# Patient Record
Sex: Female | Born: 1965 | Race: Black or African American | Hispanic: No | Marital: Single | Smoking: Former smoker
Health system: Southern US, Community
[De-identification: ages and names within clinical notes are randomized; demographics above are authoritative.]

## PROBLEM LIST (undated history)

## (undated) DIAGNOSIS — R6883 Chills (without fever): Secondary | ICD-10-CM

## (undated) DIAGNOSIS — R5383 Other fatigue: Secondary | ICD-10-CM

## (undated) DIAGNOSIS — E785 Hyperlipidemia, unspecified: Secondary | ICD-10-CM

## (undated) DIAGNOSIS — C801 Malignant (primary) neoplasm, unspecified: Secondary | ICD-10-CM

## (undated) DIAGNOSIS — I10 Essential (primary) hypertension: Secondary | ICD-10-CM

## (undated) DIAGNOSIS — R05 Cough: Secondary | ICD-10-CM

## (undated) DIAGNOSIS — R058 Other specified cough: Secondary | ICD-10-CM

## (undated) HISTORY — DX: Essential (primary) hypertension: I10

## (undated) HISTORY — DX: Other fatigue: R53.83

## (undated) HISTORY — DX: Cough: R05

## (undated) HISTORY — DX: Chills (without fever): R68.83

## (undated) HISTORY — DX: Hyperlipidemia, unspecified: E78.5

## (undated) HISTORY — DX: Other specified cough: R05.8

## (undated) HISTORY — DX: Malignant (primary) neoplasm, unspecified: C80.1

---

## 1993-08-28 HISTORY — PX: BIOPSY BREAST: PRO8

## 1994-08-28 HISTORY — PX: APPENDECTOMY: SHX54

## 2008-10-28 ENCOUNTER — Emergency Department (HOSPITAL_COMMUNITY): Admission: EM | Admit: 2008-10-28 | Discharge: 2008-10-29 | Payer: Self-pay | Admitting: Emergency Medicine

## 2010-05-17 ENCOUNTER — Encounter: Admission: RE | Admit: 2010-05-17 | Discharge: 2010-05-17 | Payer: Self-pay | Admitting: Otolaryngology

## 2010-05-17 ENCOUNTER — Inpatient Hospital Stay (HOSPITAL_COMMUNITY)
Admission: AD | Admit: 2010-05-17 | Discharge: 2010-05-22 | Payer: Self-pay | Source: Home / Self Care | Admitting: Gastroenterology

## 2010-05-17 ENCOUNTER — Encounter: Admission: RE | Admit: 2010-05-17 | Discharge: 2010-05-17 | Payer: Self-pay | Admitting: Gastroenterology

## 2010-11-10 LAB — CBC
HCT: 37.5 % (ref 36.0–46.0)
HCT: 39 % (ref 36.0–46.0)
Hemoglobin: 13.2 g/dL (ref 12.0–15.0)
MCH: 28.9 pg (ref 26.0–34.0)
MCH: 29.9 pg (ref 26.0–34.0)
MCHC: 32.9 g/dL (ref 30.0–36.0)
MCV: 87.1 fL (ref 78.0–100.0)
MCV: 87.9 fL (ref 78.0–100.0)
MCV: 88.2 fL (ref 78.0–100.0)
MCV: 89.1 fL (ref 78.0–100.0)
Platelets: 234 10*3/uL (ref 150–400)
Platelets: 237 10*3/uL (ref 150–400)
Platelets: 237 10*3/uL (ref 150–400)
RBC: 4.39 MIL/uL (ref 3.87–5.11)
RBC: 4.48 MIL/uL (ref 3.87–5.11)
RBC: 4.6 MIL/uL (ref 3.87–5.11)
RDW: 13.5 % (ref 11.5–15.5)
RDW: 13.5 % (ref 11.5–15.5)
WBC: 10.8 10*3/uL — ABNORMAL HIGH (ref 4.0–10.5)
WBC: 12.9 10*3/uL — ABNORMAL HIGH (ref 4.0–10.5)
WBC: 8.8 10*3/uL (ref 4.0–10.5)

## 2010-11-10 LAB — DIFFERENTIAL
Basophils Absolute: 0 10*3/uL (ref 0.0–0.1)
Basophils Absolute: 0 10*3/uL (ref 0.0–0.1)
Basophils Relative: 0 % (ref 0–1)
Eosinophils Absolute: 0.1 10*3/uL (ref 0.0–0.7)
Eosinophils Absolute: 0.2 10*3/uL (ref 0.0–0.7)
Eosinophils Relative: 1 % (ref 0–5)
Eosinophils Relative: 3 % (ref 0–5)
Lymphocytes Relative: 21 % (ref 12–46)
Lymphocytes Relative: 21 % (ref 12–46)
Lymphs Abs: 1.8 10*3/uL (ref 0.7–4.0)
Monocytes Absolute: 0.7 10*3/uL (ref 0.1–1.0)
Monocytes Relative: 13 % — ABNORMAL HIGH (ref 3–12)
Neutro Abs: 5.9 10*3/uL (ref 1.7–7.7)
Neutrophils Relative %: 48 % (ref 43–77)
Neutrophils Relative %: 67 % (ref 43–77)

## 2010-11-10 LAB — GLUCOSE, CAPILLARY
Glucose-Capillary: 124 mg/dL — ABNORMAL HIGH (ref 70–99)
Glucose-Capillary: 143 mg/dL — ABNORMAL HIGH (ref 70–99)
Glucose-Capillary: 144 mg/dL — ABNORMAL HIGH (ref 70–99)
Glucose-Capillary: 161 mg/dL — ABNORMAL HIGH (ref 70–99)
Glucose-Capillary: 163 mg/dL — ABNORMAL HIGH (ref 70–99)
Glucose-Capillary: 166 mg/dL — ABNORMAL HIGH (ref 70–99)
Glucose-Capillary: 170 mg/dL — ABNORMAL HIGH (ref 70–99)
Glucose-Capillary: 174 mg/dL — ABNORMAL HIGH (ref 70–99)
Glucose-Capillary: 177 mg/dL — ABNORMAL HIGH (ref 70–99)

## 2010-11-10 LAB — COMPREHENSIVE METABOLIC PANEL
AST: 26 U/L (ref 0–37)
Albumin: 3.8 g/dL (ref 3.5–5.2)
Alkaline Phosphatase: 68 U/L (ref 39–117)
Chloride: 99 mEq/L (ref 96–112)
GFR calc Af Amer: >60 mL/min (ref >60)
Potassium: 3.5 mEq/L (ref 3.5–5.1)
Sodium: 133 mEq/L — ABNORMAL LOW (ref 135–145)
Total Bilirubin: 0.6 mg/dL (ref 0.3–1.2)

## 2010-12-08 LAB — CBC
HCT: 39.8 % (ref 36.0–46.0)
MCV: 89.3 fL (ref 78.0–100.0)
RBC: 4.46 MIL/uL (ref 3.87–5.11)
WBC: 11.2 10*3/uL — ABNORMAL HIGH (ref 4.0–10.5)

## 2010-12-08 LAB — DIFFERENTIAL
Basophils Absolute: 0 10*3/uL (ref 0.0–0.1)
Basophils Relative: 0 % (ref 0–1)
Eosinophils Relative: 0 % (ref 0–5)
Lymphocytes Relative: 17 % (ref 12–46)
Neutro Abs: 8.6 10*3/uL — ABNORMAL HIGH (ref 1.7–7.7)

## 2010-12-08 LAB — PROTIME-INR: Prothrombin Time: 13.2 seconds (ref 11.6–15.2)

## 2010-12-08 LAB — URINALYSIS, ROUTINE W REFLEX MICROSCOPIC
Hgb urine dipstick: NEGATIVE
Ketones, ur: 15 mg/dL — AB
Protein, ur: 30 mg/dL — AB
Urobilinogen, UA: 1 mg/dL (ref 0.0–1.0)

## 2010-12-08 LAB — COMPREHENSIVE METABOLIC PANEL
AST: 18 U/L (ref 0–37)
BUN: 11 mg/dL (ref 6–23)
CO2: 28 mEq/L (ref 19–32)
Chloride: 100 mEq/L (ref 96–112)
Creatinine, Ser: 0.86 mg/dL (ref 0.4–1.2)
GFR calc non Af Amer: >60 mL/min (ref >60)
Total Bilirubin: 0.6 mg/dL (ref 0.3–1.2)

## 2010-12-08 LAB — URINE MICROSCOPIC-ADD ON

## 2010-12-08 LAB — URINE CULTURE: Colony Count: 45000

## 2010-12-08 LAB — PREGNANCY, URINE: Preg Test, Ur: NEGATIVE

## 2011-01-16 ENCOUNTER — Inpatient Hospital Stay (HOSPITAL_COMMUNITY)
Admission: EM | Admit: 2011-01-16 | Discharge: 2011-01-20 | DRG: 392 | Disposition: A | Discharge status: Home / Self Care | Payer: PRIVATE HEALTH INSURANCE | Attending: Internal Medicine | Admitting: Internal Medicine

## 2011-01-16 DIAGNOSIS — E663 Overweight: Secondary | ICD-10-CM | POA: Diagnosis present

## 2011-01-16 DIAGNOSIS — E119 Type 2 diabetes mellitus without complications: Secondary | ICD-10-CM | POA: Diagnosis present

## 2011-01-16 DIAGNOSIS — E876 Hypokalemia: Secondary | ICD-10-CM | POA: Diagnosis present

## 2011-01-16 DIAGNOSIS — I10 Essential (primary) hypertension: Secondary | ICD-10-CM | POA: Diagnosis present

## 2011-01-16 DIAGNOSIS — K5732 Diverticulitis of large intestine without perforation or abscess without bleeding: Principal | ICD-10-CM | POA: Diagnosis present

## 2011-01-16 DIAGNOSIS — Z853 Personal history of malignant neoplasm of breast: Secondary | ICD-10-CM

## 2011-01-16 LAB — URINALYSIS, ROUTINE W REFLEX MICROSCOPIC
Bilirubin Urine: NEGATIVE
Hgb urine dipstick: NEGATIVE
Ketones, ur: 15 mg/dL — AB
Protein, ur: 30 mg/dL — AB
Urobilinogen, UA: 0.2 mg/dL (ref 0.0–1.0)

## 2011-01-16 LAB — URINE MICROSCOPIC-ADD ON

## 2011-01-17 ENCOUNTER — Emergency Department (HOSPITAL_COMMUNITY): Payer: PRIVATE HEALTH INSURANCE

## 2011-01-17 ENCOUNTER — Encounter (HOSPITAL_COMMUNITY): Payer: Self-pay | Admitting: Radiology

## 2011-01-17 LAB — DIFFERENTIAL
Basophils Absolute: 0 10*3/uL (ref 0.0–0.1)
Basophils Relative: 0 % (ref 0–1)
Eosinophils Absolute: 0.2 10*3/uL (ref 0.0–0.7)
Eosinophils Relative: 2 % (ref 0–5)
Lymphocytes Relative: 28 % (ref 12–46)
Lymphs Abs: 2.8 10*3/uL (ref 0.7–4.0)
Monocytes Absolute: 0.9 10*3/uL (ref 0.1–1.0)
Monocytes Relative: 8 % (ref 3–12)
Neutro Abs: 6.3 10*3/uL (ref 1.7–7.7)
Neutrophils Relative %: 62 % (ref 43–77)

## 2011-01-17 LAB — CBC
HCT: 35.8 % — ABNORMAL LOW (ref 36.0–46.0)
Hemoglobin: 11.7 g/dL — ABNORMAL LOW (ref 12.0–15.0)
MCH: 28.6 pg (ref 26.0–34.0)
MCHC: 32.7 g/dL (ref 30.0–36.0)
MCV: 87.5 fL (ref 78.0–100.0)
Platelets: 244 10*3/uL (ref 150–400)
RBC: 4.09 MIL/uL (ref 3.87–5.11)
RDW: 13.6 % (ref 11.5–15.5)
WBC: 10.2 10*3/uL (ref 4.0–10.5)

## 2011-01-17 LAB — COMPREHENSIVE METABOLIC PANEL WITH GFR
ALT: 13 U/L (ref 0–35)
AST: 12 U/L (ref 0–37)
Albumin: 3.2 g/dL — ABNORMAL LOW (ref 3.5–5.2)
Alkaline Phosphatase: 84 U/L (ref 39–117)
BUN: 16 mg/dL (ref 6–23)
CO2: 32 meq/L (ref 19–32)
Calcium: 10.4 mg/dL (ref 8.4–10.5)
Chloride: 96 meq/L (ref 96–112)
Creatinine, Ser: 0.75 mg/dL (ref 0.4–1.2)
GFR calc non Af Amer: >60 mL/min
Glucose, Bld: 179 mg/dL — ABNORMAL HIGH (ref 70–99)
Potassium: 3.2 meq/L — ABNORMAL LOW (ref 3.5–5.1)
Sodium: 137 meq/L (ref 135–145)
Total Bilirubin: 0.2 mg/dL — ABNORMAL LOW (ref 0.3–1.2)
Total Protein: 8.2 g/dL (ref 6.0–8.3)

## 2011-01-17 LAB — PREGNANCY, URINE: Preg Test, Ur: NEGATIVE

## 2011-01-17 LAB — GLUCOSE, CAPILLARY
Glucose-Capillary: 151 mg/dL — ABNORMAL HIGH (ref 70–99)
Glucose-Capillary: 125 mg/dL — ABNORMAL HIGH (ref 70–99)
Glucose-Capillary: 105 mg/dL — ABNORMAL HIGH (ref 70–99)

## 2011-01-17 LAB — LIPASE, BLOOD: Lipase: 42 U/L (ref 11–59)

## 2011-01-17 LAB — POTASSIUM: Potassium: 4.1 meq/L (ref 3.5–5.1)

## 2011-01-17 MED ORDER — IOHEXOL 300 MG/ML  SOLN
100.0000 mL | Freq: Once | INTRAMUSCULAR | Status: AC | PRN
  Administered 2011-01-17: 100 mL via INTRAVENOUS

## 2011-01-18 LAB — DIFFERENTIAL
Basophils Relative: 0 % (ref 0–1)
Eosinophils Relative: 3 % (ref 0–5)
Lymphocytes Relative: 44 % (ref 12–46)
Monocytes Absolute: 0.5 10*3/uL (ref 0.1–1.0)
Monocytes Relative: 8 % (ref 3–12)
Neutrophils Relative %: 45 % (ref 43–77)

## 2011-01-18 LAB — BASIC METABOLIC PANEL
BUN: 8 mg/dL (ref 6–23)
CO2: 27 mEq/L (ref 19–32)
Chloride: 100 mEq/L (ref 96–112)
Creatinine, Ser: 0.71 mg/dL (ref 0.4–1.2)
Glucose, Bld: 117 mg/dL — ABNORMAL HIGH (ref 70–99)
Potassium: 4 mEq/L (ref 3.5–5.1)

## 2011-01-18 LAB — CBC
HCT: 34.9 % — ABNORMAL LOW (ref 36.0–46.0)
Hemoglobin: 11.5 g/dL — ABNORMAL LOW (ref 12.0–15.0)
MCH: 28.9 pg (ref 26.0–34.0)
MCV: 87.7 fL (ref 78.0–100.0)
RBC: 3.98 MIL/uL (ref 3.87–5.11)

## 2011-01-18 LAB — GLUCOSE, CAPILLARY: Glucose-Capillary: 176 mg/dL — ABNORMAL HIGH (ref 70–99)

## 2011-01-19 LAB — GLUCOSE, CAPILLARY
Glucose-Capillary: 148 mg/dL — ABNORMAL HIGH (ref 70–99)
Glucose-Capillary: 136 mg/dL — ABNORMAL HIGH (ref 70–99)

## 2011-01-19 NOTE — Consult Note (Signed)
  [name], Mia [name]             ACCOUNT NO.:  [id number]  MEDICAL RECORD NO.:  [id number]           PATIENT TYPE:  I  LOCATION:  5020                         FACILITY:  [facility]  PHYSICIAN:  Wilmon Arms. Corliss Skains, M.D. DATE OF BIRTH:  05/04/1966  DATE OF CONSULTATION:  01/16/2011 DATE OF DISCHARGE:                                CONSULTATION   REASON FOR EVALUATION:  Sigmoid diverticulitis.  HISTORY OF PRESENT ILLNESS:  This is a 45 year old female who has had at least 3-4 previous episodes of sigmoid diverticulitis who presents with several weeks of worsening left lower quadrant suprapubic abdominal pain.  The patient has had previous GI workup with Dr. Karma Greaser of the [facility] as well as gastroenterologist in the [place] area. The patient has had intermittent diarrhea for the last several weeks. She denies any nausea or vomiting.  Her last colonoscopy was apparently late last year by Dr. Evette Cristal.  PAST MEDICAL HISTORY: 1. Diabetes type 2. 2. Mild hypertension. 3. History of breast cancer at age 40, status post chemo radiation and     surgery. 4. Irritable bowel syndrome.  PAST SURGICAL HISTORY:  Appendectomy, right breast lumpectomy, axillary lymph node dissection with 4 positive lymph nodes  ALLERGIES:  None.  MEDICATIONS:  Hydrochlorothiazide, metformin, and red yeast rice.  SOCIAL HISTORY:  Nonsmoker, social alcohol use.  REVIEW OF SYSTEMS:  Otherwise negative.  PHYSICAL EXAMINATION:  VITAL SIGNS:  Temperature 97.6, blood pressure 113/76, pulse 93, respirations 18, and sats 100%. GENERAL:  This is a well-developed, well-nourished female in no apparent distress. HEENT:  EOMI.  Sclerae icteric. NECK:  No mass.  No thyromegaly. LUNGS:  Clear.  Normal respiratory effort. HEART:  Regular rate and rhythm.  No murmur. ABDOMEN:  Healed laparoscopic incisions, occasional bowel sounds, mildly distended, tender in the left lower quadrant as well as in the suprapubic  region.  No rebound or guarding.  LABORATORY DATA:  White count 10.2, hemoglobin 11.7, and platelet count 244.  Sodium 137, potassium 3.2, BUN and creatinine 16 and 0.79.  CT scan of the abdomen and pelvis shows acute diverticulitis of the proximal sigmoid and descending colon with a 2.1-cm phlegmon, possible micro perforation in the lower midline.  IMPRESSION:  Acute diverticulitis with possible micro perforation and phlegmon.  RECOMMENDATIONS:  Bowel rest and IV antibiotics.  Hopefully, we can successfully treat this nonoperatively this admission but the patient will almost definitely need an elective colon resection.  Please try to obtain a colonoscopy report from the [facility], Dr. Luan Moore office.     Wilmon Arms. Corliss Skains, M.D.     MKT/MEDQ  D:  01/17/2011  T:  01/17/2011  Job:  [id number]  Electronically Signed by Manus Rudd M.D. on 01/19/2011 10:07:12 PM

## 2011-01-20 LAB — GLUCOSE, CAPILLARY
Glucose-Capillary: 124 mg/dL — ABNORMAL HIGH (ref 70–99)
Glucose-Capillary: 144 mg/dL — ABNORMAL HIGH (ref 70–99)

## 2011-02-04 NOTE — H&P (Signed)
Mia [name], Mia [name]             ACCOUNT NO.:  [id number]  MEDICAL RECORD NO.:  [id number]           PATIENT TYPE:  I  LOCATION:  5020                         FACILITY:  [facility]  PHYSICIAN:  Della Goo, M.D. DATE OF BIRTH:  July 07, 1966  DATE OF ADMISSION:  01/16/2011 DATE OF DISCHARGE:                             HISTORY & PHYSICAL   PRIMARY CARE PHYSICIAN:  Deirdre Peer. Polite, MD  CHIEF COMPLAINT:  Abdominal pain.  HISTORY OF PRESENT ILLNESS:  This is a 45 year old female who presented to the emergency department secondary to worsening abdominal pain in the lower quadrants, both left and right.  She has had pain off and on since December 12, 2010, and reports taking medications she had left over from a previous bouts of diverticulitis, which dampen the pain some.  She reports that the pain worsened over the past week.  She states she was having sweats and chills.  She denied having any nausea or vomiting. Denied having any hematemesis, hematochezia or melena passage.  She did report having some loose stool.  The patient was seen in the emergency department and a CT scan of the abdomen and pelvis was performed.  Results of which revealed acute diverticulitis of the distal descending, proximal sigmoid colon, a 2.1 cm phlegmon superior to the mid sigmoid colon was seen and probable microperforation or large inflamed diverticulum was seen.  The patient was started on IV antibiotic therapy of ciprofloxacin and Flagyl and referred for medical admission.  PAST MEDICAL HISTORY:  Significant for diverticulosis and diverticulitis, irritable bowel syndrome, hypertension, remote history of breast cancer, status post lumpectomy with chemotherapy and radiation treatments.  MEDICATIONS:  Include hydrochlorothiazide and metformin.  ALLERGIES:  No known drug allergies.  SOCIAL HISTORY:  The patient is a nonsmoker, nondrinker.  No history of illicit drug usage.  FAMILY HISTORY:   Positive for coronary artery disease and diabetes in her mother, positive hypertension in her father and positive cancer in her grandmother.  REVIEW OF SYSTEMS:  Pertinent as mentioned above.  PHYSICAL EXAMINATION:  GENERAL:  This is an obese 45 year old African American female who is in discomfort but no acute distress. VITAL SIGNS:  Temperature 98.6, blood pressure 119/76, heart rate 87, respirations 19, O2 saturation 98-100%. HEENT:  Normocephalic, atraumatic.  Pupils equally round and reactive to light.  Extraocular movements are intact.  Funduscopic benign.  Nares are patent bilaterally.  Oropharynx is clear.  Neck:  Supple.  Full range of motion.  No thyromegaly, adenopathy or jugular venous distention. CARDIOVASCULAR:  Regular rate and rhythm.  No murmurs, gallops or rubs. LUNGS:  Clear to auscultation bilaterally.  No rales, rhonchi or wheezes. ABDOMEN:  Positive bowel sounds, mildly decreased, soft, mildly tender in the lower quadrant.  No rebound.  No guarding.  No hepatosplenomegaly. EXTREMITIES:  Without cyanosis, clubbing or edema. NEUROLOGIC:  The patient is alert and oriented x3.  Cranial nerves are intact and motor and sensory function also intact.  LABORATORY STUDIES:  White blood cell count 10.2, hemoglobin 11.7, hematocrit 35.8, platelets 244, neutrophils 62%, lymphocytes 28%. Sodium 137, potassium 3.2, chloride 96, CO2 32, BUN 16, creatinine 0.75 and  glucose 179.  Lipase 42.  Urine HCG negative.  Urinalysis negative except for protein of 30.  Urine microscopic many epithelial cells, many bacteria.  Urinary white blood cell 0-2.  Urinary red blood cell 0-2. CT scan results as mentioned above.  ASSESSMENT:  A 45 year old female being admitted with: 1. Acute diverticulitis. 2. Abdominal pain. 3. Hyperglycemia. 4. Hypokalemia.  PLAN:  The patient will be admitted and IV antibiotic therapy will be started with ciprofloxacin and metronidazole.  Pain control  therapy has been ordered along with antiemetic therapy as needed.  Potassium supplementation has been administered in the emergency department and DVT prophylaxis has been ordered as well and sliding-scale insulin coverage has been ordered for elevated blood sugars since the patient has an elevated blood sugar on admission.  She does take metformin therapy.  This will be held for now.  Code status is a full code at this time.     Della Goo, M.D.     HJ/MEDQ  D:  01/17/2011  T:  01/17/2011  Job:  [id number]  cc:   Deirdre Peer. Polite, M.D.  Electronically Signed by Della Goo M.D. on 02/04/2011 06:22:36 AM

## 2011-02-07 NOTE — Discharge Summary (Signed)
  NAMELAVONDA, Mia [name]             ACCOUNT NO.:  [id number]  MEDICAL RECORD NO.:  [id number]           PATIENT TYPE:  I  LOCATION:  5020                         FACILITY:  [facility]  PHYSICIAN:  Mia [name], M.D. DATE OF BIRTH:  Jan 30, 1966  DATE OF ADMISSION:  01/16/2011 DATE OF DISCHARGE:  01/20/2011                              DISCHARGE SUMMARY   DISCHARGE DIAGNOSES: 1. Diverticulitis with associated phlegmon, treated with IV Cipro and     Flagyl, converted to p.o. Cipro and Flagyl, tolerating p.o. diet at     the time of discharge.  Please note the patient has been seen by     Surgery during this hospitalization with plans for outpatient     followup to discuss colectomy. 2. Hypertension. 3. Diabetes. 4. Overweight. 5. History of breast cancer.  DISCHARGE MEDICATIONS: 1. Cipro 500 mg b.i.d. 2. Flagyl 500 mg q.8 h. 3. Oxycodone 5 mg q.4 h. p.r.n. 4. Hydrochlorothiazide 25 mg daily. 5. Metamucil daily. 6. Metformin XR 500 mg b.i.d. 7. Multivitamin daily. 8. Red yeast rice. 9. Vitamin D.  DISPOSITION:  The patient discharged to home in stable condition.  I asked follow with primary MD in 1 week.  I asked to follow up with surgeon in approximately 3 weeks.  CONSULTANTS:  General Surgery.  STUDIES:  CBC:  White count 6.1, hemoglobin 11.5, platelet 254.  Basic metabolic panel unremarkable.  Hemoglobin A1c 7.6.  CT of the abdomen and pelvis, acute diverticulitis involving the distal descending proximal sigmoid colon, 2.1 cm phlegmon noted directly superior to the mid sigmoid colon with a few tiny foci of a possibly reflecting microperforation or any inflamed large diverticulum.  No evidence of abscess formation at this time.  HISTORY OF PRESENT ILLNESS:  A 45 year old female who presented to the ED with complaint of the abdominal pain, which had been on and off since April 2011.  Of note, she states she had some antibiotics at home, which she took a few of, which  helped; however, her symptoms worsened. Therefore, she presented to the ED with above complaint.  CT in the emergency room revealed acute diverticulitis.  Admission was deemed necessary for further evaluation and treatment.  Please see dictated H and P for further details.  Past medical history, medications, social history, allergies, and family history per admission H and P.  HOSPITAL COURSE:  The patient was admitted to the medicine floor bed for evaluation and treatment of acute diverticulitis with associated phlegmon.  Please note Surgery was consulted.  The patient was placed on bowel rest, IV fluids, and IV antibiotics.  Her hospital course was one of slow, but continued improvement.  She was ultimately converted to p.o. antibiotics and resumption of diet with good tolerance.  The patient is discharged in stable condition.  FOLLOWUP:  As discussed above.     Mia [name], M.D.     RDP/MEDQ  D:  01/20/2011  T:  01/21/2011  Job:  [id number]  Electronically Signed by Windy Fast Fartun Paradiso M.D. on 02/07/2011 08:31:35 AM

## 2011-03-08 ENCOUNTER — Encounter (INDEPENDENT_AMBULATORY_CARE_PROVIDER_SITE_OTHER): Payer: Self-pay | Admitting: General Surgery

## 2011-03-08 ENCOUNTER — Ambulatory Visit (INDEPENDENT_AMBULATORY_CARE_PROVIDER_SITE_OTHER): Payer: PRIVATE HEALTH INSURANCE | Admitting: General Surgery

## 2011-03-08 VITALS — BP 134/90 | HR 68 | Temp 96.0°F | Ht 59.0 in | Wt 183.2 lb

## 2011-03-08 DIAGNOSIS — K5732 Diverticulitis of large intestine without perforation or abscess without bleeding: Secondary | ICD-10-CM

## 2011-03-08 MED ORDER — METRONIDAZOLE 500 MG PO TABS: 500.0000 mg | ORAL_TABLET | Freq: Three times a day (TID) | ORAL | Status: AC

## 2011-03-08 MED ORDER — CIPROFLOXACIN HCL 500 MG PO TABS: 500.0000 mg | ORAL_TABLET | Freq: Two times a day (BID) | ORAL | Status: AC

## 2011-03-08 NOTE — Progress Notes (Signed)
Subjective:     Patient ID: Mia [name], female   DOB: 1966-03-10, 45 y.o.   MRN: [id number]    BP 134/90  Pulse 68  Temp(Src) 96 F (35.6 C) (Temporal)  Ht 4\' 11"  (1.499 m)  Wt 183 lb 3.2 oz (83.099 kg)  BMI 37.00 kg/m2    HPI Patient returns for followup after hospitalization for sigmoid diverticulitis with phlegmon. The patient was discharged on 01/20/11. She was discharged on ciprofloxacin and Flagyl. She ran out of the Cipro but is taking the Flagyl. She still has pain in the lower abdomen on the left side & in the suprapubic area. Bowel movements are normal once a day. She is otherwise feeling well. She is interested in having surgery.  Review of Systems  Constitutional: Positive for chills and fatigue.  HENT: Negative.   Respiratory: Negative.   Cardiovascular: Negative.   Gastrointestinal: Positive for abdominal pain.  Musculoskeletal: Negative.   Skin: Negative.   Neurological: Negative.   Hematological: Negative.   Psychiatric/Behavioral: Negative.        Objective:   Physical Exam  Constitutional: She is oriented to person, place, and time. She appears well-developed and well-nourished. No distress.  HENT:  Head: Normocephalic and atraumatic.  Nose: Nose normal.  Mouth/Throat: Oropharynx is clear and moist.  Eyes: Pupils are equal, round, and reactive to light. Right eye exhibits no discharge. No scleral icterus.  Neck: Normal range of motion.  Cardiovascular: Normal rate, regular rhythm, normal heart sounds and intact distal pulses.   No murmur heard. Pulmonary/Chest: Effort normal. No respiratory distress. She has no wheezes. She has no rales. She exhibits no tenderness.  Abdominal: Soft. She exhibits mass. She exhibits no distension. There is tenderness. There is no rebound and no guarding.  Musculoskeletal: Normal range of motion. She exhibits no edema.  Neurological: She is alert and oriented to person, place, and time.  Skin: Skin is warm and dry.      Of note her abdominal tenderness is mostly in the left lower quadrant and suprapubic area. There is a vague fullness there. Assessment:     Sigmoid diverticulitis with phlegmon. There is still ongoing inflammation precluding surgery until this resolves.    Plan:     Refill Cipro and Flagyl. I will see her back in 6 weeks. At that time if she is better, we will move forward with planning elective colectomy as a single-stage procedure. This procedure including some of the risk and benefits were discussed at this time. We also discussed the process of getting bowel prep. We will go over things further on her next visit .

## 2011-03-13 ENCOUNTER — Telehealth (INDEPENDENT_AMBULATORY_CARE_PROVIDER_SITE_OTHER): Payer: Self-pay | Admitting: General Surgery

## 2011-03-15 ENCOUNTER — Telehealth (INDEPENDENT_AMBULATORY_CARE_PROVIDER_SITE_OTHER): Payer: Self-pay

## 2011-03-15 NOTE — Telephone Encounter (Signed)
Mia [name] called and said her cipro medicine was $100 after insurance and was wondering if there was any other medication that could be called in in place of cipro that cost less.

## 2011-05-03 ENCOUNTER — Encounter (INDEPENDENT_AMBULATORY_CARE_PROVIDER_SITE_OTHER): Payer: Self-pay | Admitting: General Surgery

## 2011-05-03 ENCOUNTER — Ambulatory Visit (INDEPENDENT_AMBULATORY_CARE_PROVIDER_SITE_OTHER): Payer: PRIVATE HEALTH INSURANCE | Admitting: General Surgery

## 2011-05-03 VITALS — BP 142/94 | HR 80

## 2011-05-03 DIAGNOSIS — K5732 Diverticulitis of large intestine without perforation or abscess without bleeding: Secondary | ICD-10-CM

## 2011-05-03 NOTE — Progress Notes (Signed)
Subjective:     Patient ID: Mia [name], female   DOB: [birth year], 45 y.o.   MRN: [id number]  HPI Patient presents for followup status post hospitalization for significant rectosigmoid diverticulitis. She perforation abscess at that time. Since then she is doing much better. She still occasionally has crampy abdominal pain for which she takes some pain medication she is eating and moving her bowels without difficulty. She's had no fevers. She has some occasional hot flashes which she feels is premenopausal. She is going to see her gynecologist next week for his other complaint.  Review of Systems  Constitutional: Negative for chills and appetite change.  HENT: Negative.   Eyes: Negative.   Respiratory: Negative.   Cardiovascular: Negative.   Gastrointestinal: Negative for abdominal distention.  Genitourinary: Negative.        Objective:   Physical Exam  Constitutional: She is oriented to person, place, and time. She appears well-developed and well-nourished.  HENT:  Head: Normocephalic.  Mouth/Throat: Oropharynx is clear and moist.  Eyes: Conjunctivae are normal. Pupils are equal, round, and reactive to light.  Neck: Neck supple. No tracheal deviation present.  Cardiovascular: Normal rate, regular rhythm and normal heart sounds.   Pulmonary/Chest: Effort normal and breath sounds normal. She has no wheezes. She has no rales. She exhibits no tenderness.  Abdominal: Soft. She exhibits no distension and no mass. There is no tenderness. There is no rebound and no guarding.  Neurological: She is alert and oriented to person, place, and time.  Skin: Skin is warm.       Assessment:     Rectosigmoid diverticulitis. Occasional continued pain.    Plan:     As per previous lengthy discussion, I offered colon resection of the affected area. We discussed the procedure risks and benefits in detail. Plan sigmoid or rectosigmoid resection with primary anastomosis. She was given literature on  her colon surgery. Risks including but not limited to bleeding infection anastomotic leak abscess cardiac and pulmonary consultations were discussed. She is agreeable questions were answered. We discussed the bowel prep. We look forward to scheduling in the near future.

## 2011-06-06 ENCOUNTER — Encounter (HOSPITAL_COMMUNITY)
Admission: RE | Admit: 2011-06-06 | Discharge: 2011-06-06 | Disposition: A | Discharge status: Home / Self Care | Payer: PRIVATE HEALTH INSURANCE | Source: Ambulatory Visit | Attending: General Surgery | Admitting: General Surgery

## 2011-06-06 ENCOUNTER — Other Ambulatory Visit (INDEPENDENT_AMBULATORY_CARE_PROVIDER_SITE_OTHER): Payer: Self-pay | Admitting: General Surgery

## 2011-06-06 LAB — CBC
Hemoglobin: 13.4 g/dL (ref 12.0–15.0)
MCH: 29.5 pg (ref 26.0–34.0)
MCHC: 33.4 g/dL (ref 30.0–36.0)
MCV: 88.1 fL (ref 78.0–100.0)
RBC: 4.55 MIL/uL (ref 3.87–5.11)

## 2011-06-06 LAB — BASIC METABOLIC PANEL
Calcium: 10.3 mg/dL (ref 8.4–10.5)
Creatinine, Ser: 0.72 mg/dL (ref 0.50–1.10)
GFR calc Af Amer: >90 mL/min (ref >90)
GFR calc non Af Amer: >90 mL/min (ref >90)
Sodium: 136 mEq/L (ref 135–145)

## 2011-06-06 LAB — SURGICAL PCR SCREEN: Staphylococcus aureus: NEGATIVE

## 2011-06-06 LAB — TYPE AND SCREEN
ABO/RH(D): O POS
Antibody Screen: NEGATIVE

## 2011-06-06 LAB — HCG, SERUM, QUALITATIVE: Preg, Serum: NEGATIVE

## 2011-06-12 ENCOUNTER — Inpatient Hospital Stay (HOSPITAL_COMMUNITY)
Admission: RE | Admit: 2011-06-12 | Discharge: 2011-06-18 | DRG: 331 | Disposition: A | Discharge status: Home / Self Care | Payer: PRIVATE HEALTH INSURANCE | Source: Ambulatory Visit | Attending: General Surgery | Admitting: General Surgery

## 2011-06-12 ENCOUNTER — Other Ambulatory Visit (INDEPENDENT_AMBULATORY_CARE_PROVIDER_SITE_OTHER): Payer: Self-pay | Admitting: General Surgery

## 2011-06-12 DIAGNOSIS — Z01818 Encounter for other preprocedural examination: Secondary | ICD-10-CM

## 2011-06-12 DIAGNOSIS — Z01812 Encounter for preprocedural laboratory examination: Secondary | ICD-10-CM

## 2011-06-12 DIAGNOSIS — K5732 Diverticulitis of large intestine without perforation or abscess without bleeding: Principal | ICD-10-CM | POA: Diagnosis present

## 2011-06-12 DIAGNOSIS — I10 Essential (primary) hypertension: Secondary | ICD-10-CM | POA: Diagnosis present

## 2011-06-12 DIAGNOSIS — E8779 Other fluid overload: Secondary | ICD-10-CM | POA: Diagnosis not present

## 2011-06-12 DIAGNOSIS — E119 Type 2 diabetes mellitus without complications: Secondary | ICD-10-CM | POA: Diagnosis present

## 2011-06-12 DIAGNOSIS — Z01810 Encounter for preprocedural cardiovascular examination: Secondary | ICD-10-CM

## 2011-06-12 DIAGNOSIS — Z79899 Other long term (current) drug therapy: Secondary | ICD-10-CM

## 2011-06-12 DIAGNOSIS — Z853 Personal history of malignant neoplasm of breast: Secondary | ICD-10-CM

## 2011-06-12 HISTORY — PX: OTHER SURGICAL HISTORY: SHX169

## 2011-06-12 LAB — GLUCOSE, CAPILLARY
Glucose-Capillary: 151 mg/dL — ABNORMAL HIGH (ref 70–99)
Glucose-Capillary: 161 mg/dL — ABNORMAL HIGH (ref 70–99)
Glucose-Capillary: 170 mg/dL — ABNORMAL HIGH (ref 70–99)
Glucose-Capillary: 259 mg/dL — ABNORMAL HIGH (ref 70–99)

## 2011-06-13 DIAGNOSIS — E119 Type 2 diabetes mellitus without complications: Secondary | ICD-10-CM

## 2011-06-13 LAB — CBC
HCT: 34.4 % — ABNORMAL LOW (ref 36.0–46.0)
Platelets: 235 10*3/uL (ref 150–400)
RBC: 3.83 MIL/uL — ABNORMAL LOW (ref 3.87–5.11)
RDW: 13.8 % (ref 11.5–15.5)
WBC: 11.7 10*3/uL — ABNORMAL HIGH (ref 4.0–10.5)

## 2011-06-13 LAB — GLUCOSE, CAPILLARY: Glucose-Capillary: 136 mg/dL — ABNORMAL HIGH (ref 70–99)

## 2011-06-13 LAB — BASIC METABOLIC PANEL
BUN: 7 mg/dL (ref 6–23)
CO2: 25 mEq/L (ref 19–32)
Chloride: 101 mEq/L (ref 96–112)
GFR calc Af Amer: >90 mL/min (ref >90)
Potassium: 4.1 mEq/L (ref 3.5–5.1)

## 2011-06-13 NOTE — Op Note (Signed)
NAMEANESHA, HACKERT             ACCOUNT NO.:  [id number]  MEDICAL RECORD NO.:  [id number]  LOCATION:  5153                         FACILITY:  [facility]  PHYSICIAN:  Gabrielle Dare. Janee Morn, M.D.DATE OF BIRTH:  [birth year]  DATE OF PROCEDURE:  06/12/2011 DATE OF DISCHARGE:                              OPERATIVE REPORT   PREOPERATIVE DIAGNOSIS:  Sigmoid diverticulitis.  POSTOPERATIVE DIAGNOSIS:  Sigmoid diverticulitis with evidence of previous abscess in the pelvis.  PROCEDURE:  Sigmoid colectomy.  SURGEON:  Gabrielle Dare. Janee Morn, MD  ESTIMATED BLOOD LOSS:  200 mL.  HISTORY OF PRESENT ILLNESS:  Ms. Bevans is a 45 year old African American female who has had multiple episodes of recurrent sigmoid diverticulitis.  The last episode she had was in April of this year and she was admitted, she was treated with IV antibiotics.  There was a phlegmon present on CT scan, however, no discrete abscess that required drainage.  She was treated with antibiotics and improved significantly due to her multiple attacks over the past 8 years.  Recommendation was for sigmoid colectomy.  We allowed time for the inflammation to go down to give [place] the best chance of her healing of primary anastomosis.  She underwent a bowel prep and presents for elective colectomy.  PROCEDURE IN DETAIL:  Informed consent was obtained.  The patient was identified in the preop holding area.  She received intravenous antibiotics.  She is on enteric protocol.  She was brought to the operating room.  General endotracheal anesthesia was administered by the Anesthesia staff.  Her abdomen was prepped and draped in sterile fashion.  She was in lithotomy position and her perineum and perirectal area was also prepped and draped in sterile fashion.  A Foley catheter had been placed by the nursing staff prior to this.  Next, we did time- out procedure, then a lower midline incision was made and extended up just above the umbilicus.   Subcutaneous tissues were dissected down, revealing the anterior fascia.  This was divided sharply along the midline and the peritoneal cavity was entered under direct vision.  The fascia was opened to the length of the skin incision.  Exploration revealed evidence of chronic diverticulitis in the sigmoid colon extending down to the rectosigmoid.  There was evidence of a previous abscess formation with the cecum actually stuck to the right side of the colon in a dense fashion.  Other adhesions that were present down in the pelvis were taken down carefully.  Next, we carefully freed up the cecum from the sidewall of sigmoid colon.  This was very dense and woody and on the sigmoid colon side of inflammation, we were able to bring the sigmoid off without making any perforations.  There was a small serosal tear, however, and this was repaired with interrupted 2-0 silk sutures. Next, the sigmoid colon was mobilized from its lateral peritoneal attachments and that was continued up along the white line of Toldt up along the left gutter.  The sigma was the delivered further up out of the pelvis and we could see down in the peritoneal reflection, the disease process extended from the proximal sigmoid down to the rectosigmoid area.  There was an area clear  of inflammation, however, just above the peritoneal reflection and there was no significant diverticular disease at that point.  Next, we identified the ureter near the left common iliac and the gonadal vessels.  Due to the chronic inflammation, this took a bit of dissection, but we were able to be locate it and keep it far away from our field on the left side.  Next, the proximal sigmoid was divided with a GIA 75 stapler and a very healthy level and above the area of severe inflammation.  We then took down the mesentery of the colon with a combination of LigaSure and clamps and ties with 0 silk sutures.  Good hemostasis was kept along the way  and we stayed along the colon and well away from the ureters.  We continued this down until we were pass all disease process in the rectosigmoid.  This area was cleared off a little bit further and then divided with a contour stapler.  The specimen was marked for anatomic orientation and passed off.  We then mobilized the left colon up and including a portion of the beginnings of the splenic flexure and then mobilized some of the filmy part of the mesentery more distally.  There were some small bleeders there, they were suture ligated x1.  The colon remained pink and viable.  At this time, the pelvis was irrigated.  Our distal rectosigmoid stump looked good.  We cleared off the end of our proximal stump and then removed the staple line.  Sizers were used and the colon was in general very small, only a 25 sizer could fit and that was a bit snug, so we selected a 25 EEA stapler.  Next, the anvil from the EEA was placed in the colon and then after a secured pursestring suture of 3-0 Prolene was placed, the anvil was inserted and the pursestring was tied securely.  Next, assistant went down below and did rigid procto, examined the stump, and noted no masses.  The EEA stapler was then inserted with assistance from below.  The point was advanced out posterior to the contour staple line and the rectosigmoid stump are proximal and laid down nicely without any tension.  This was attached to the EEA and the EEA stapler was closed and fired in standard fashion. The staple line was visualized and appeared intact.  Procto was reinserted by the assistant and the colon was insufflated with air while the abdomen was filled with saline.  There were no air bubbles.  There was no bleeding noted on the procto.  The procto was removed and we all changed our gloves.  The staple line was inspected and appeared completely viable and intact.  There was no significant tension.  The cecum was reinspected and that  serosal repair area looked good.  All sponges were removed from the abdomen.  The abdomen was copiously irrigated with warm saline, irrigation, fluid returned clear.  The omentum and bowel were returned to anatomic position and the fascia was closed with running #1 looped PDS from each end of the fascia and tied in the middle.  Subcu tissues were irrigated and skin was closed loosely with staples followed by Telfa wicks.  Sponge, needle, and instrument counts were again correct at the completion of the procedure.  The patient tolerated the procedure well without apparent complication and was taken to recovery room in stable condition.     Gabrielle Dare Janee Morn, M.D.     BET/MEDQ  D:  06/12/2011  T:  06/13/2011  Job:  [id number]  cc:   Deirdre Peer. Polite, M.D. Graylin Shiver, M.D.  Electronically Signed by Violeta Gelinas M.D. on 06/13/2011 01:32:40 PM

## 2011-06-14 LAB — CBC
MCH: 29.1 pg (ref 26.0–34.0)
MCV: 89.4 fL (ref 78.0–100.0)
Platelets: 230 10*3/uL (ref 150–400)
RDW: 13.8 % (ref 11.5–15.5)

## 2011-06-14 LAB — GLUCOSE, CAPILLARY
Glucose-Capillary: 116 mg/dL — ABNORMAL HIGH (ref 70–99)
Glucose-Capillary: 120 mg/dL — ABNORMAL HIGH (ref 70–99)
Glucose-Capillary: 137 mg/dL — ABNORMAL HIGH (ref 70–99)

## 2011-06-14 LAB — BASIC METABOLIC PANEL
Calcium: 8.4 mg/dL (ref 8.4–10.5)
Creatinine, Ser: 0.75 mg/dL (ref 0.50–1.10)
GFR calc Af Amer: >90 mL/min (ref >90)
GFR calc non Af Amer: >90 mL/min (ref >90)
Sodium: 138 mEq/L (ref 135–145)

## 2011-06-15 DIAGNOSIS — D649 Anemia, unspecified: Secondary | ICD-10-CM

## 2011-06-15 DIAGNOSIS — E8779 Other fluid overload: Secondary | ICD-10-CM

## 2011-06-15 LAB — GLUCOSE, CAPILLARY
Glucose-Capillary: 105 mg/dL — ABNORMAL HIGH (ref 70–99)
Glucose-Capillary: 109 mg/dL — ABNORMAL HIGH (ref 70–99)
Glucose-Capillary: 122 mg/dL — ABNORMAL HIGH (ref 70–99)
Glucose-Capillary: 127 mg/dL — ABNORMAL HIGH (ref 70–99)
Glucose-Capillary: 90 mg/dL (ref 70–99)

## 2011-06-16 LAB — GLUCOSE, CAPILLARY
Glucose-Capillary: 112 mg/dL — ABNORMAL HIGH (ref 70–99)
Glucose-Capillary: 131 mg/dL — ABNORMAL HIGH (ref 70–99)
Glucose-Capillary: 103 mg/dL — ABNORMAL HIGH (ref 70–99)

## 2011-06-17 LAB — GLUCOSE, CAPILLARY
Glucose-Capillary: 131 mg/dL — ABNORMAL HIGH (ref 70–99)
Glucose-Capillary: 119 mg/dL — ABNORMAL HIGH (ref 70–99)

## 2011-06-19 ENCOUNTER — Encounter (INDEPENDENT_AMBULATORY_CARE_PROVIDER_SITE_OTHER): Payer: Self-pay

## 2011-06-19 LAB — GLUCOSE, CAPILLARY: Glucose-Capillary: 119 mg/dL — ABNORMAL HIGH (ref 70–99)

## 2011-06-21 ENCOUNTER — Ambulatory Visit (INDEPENDENT_AMBULATORY_CARE_PROVIDER_SITE_OTHER): Payer: PRIVATE HEALTH INSURANCE | Admitting: General Surgery

## 2011-06-21 ENCOUNTER — Encounter (INDEPENDENT_AMBULATORY_CARE_PROVIDER_SITE_OTHER): Payer: Self-pay | Admitting: General Surgery

## 2011-06-21 VITALS — BP 122/82 | HR 60 | Temp 97.2°F | Resp 18 | Ht 59.0 in | Wt 177.4 lb

## 2011-06-21 DIAGNOSIS — Z9889 Other specified postprocedural states: Secondary | ICD-10-CM

## 2011-06-21 DIAGNOSIS — Z9049 Acquired absence of other specified parts of digestive tract: Secondary | ICD-10-CM

## 2011-06-21 MED ORDER — AMOXICILLIN-POT CLAVULANATE 875-125 MG PO TABS: 1.0000 | ORAL_TABLET | Freq: Two times a day (BID) | ORAL | Status: AC

## 2011-06-21 NOTE — Progress Notes (Signed)
Subjective:     Patient ID: Mia [name], female   DOB: February 19, 1966, 45 y.o.   MRN: [id number]  HPI Patient is status post sigmoid colectomy for recurrent diverticulitis. She's been doing well at home. She is a small amount of drainage from her wound. She is eating and passing a lot of gas. She is moving her bowels several times but not everyday. She still taking pain medication.  Review of Systems     Objective:   Physical Exam    Abdomen is soft and nondistended there is mild incisional tenderness. There is no generalized tenderness. Bowel sounds are good. The upper third of her wound has a small amount of erythema. There is some clear drainage. 2 staples were removed and some serum was evacuated. There was no frank purulence. Because dressing was applied and the rest of the staples were left in place. Assessment:     Coming along well after sigmoid colectomy for recurrent diverticulitis    Plan:     Instructions were given to place the patient on Augmentin for a ten-day course. I will see her back next week. She'll continue to avoid heavy lifting for 6 weeks after surgery.

## 2011-06-21 NOTE — Patient Instructions (Signed)
Change gauze daily. Take antibiotics as directed.

## 2011-06-25 NOTE — Discharge Summary (Signed)
  NAMEKAMORIE, ALDOUS             ACCOUNT NO.:  [id number]  MEDICAL RECORD NO.:  [id number]  LOCATION:  5153                         FACILITY:  [facility]  PHYSICIAN:  Gabrielle Dare. Janee Morn, M.D.DATE OF BIRTH:  Dec 02, 1965  DATE OF ADMISSION:  06/12/2011 DATE OF DISCHARGE:  06/18/2011                              DISCHARGE SUMMARY   DISCHARGE DIAGNOSIS:  Status post sigmoid colectomy for chronic recurrent sigmoid diverticulitis.  HISTORY OF PRESENT ILLNESS:  Ms.  Oliger is a 45 year old African American female with a history of diabetes who has had chronic recurrent diverticulitis.  She presents for elective resection.  HOSPITAL COURSE:  The patient underwent an uncomplicated sigmoid colectomy with primary anastomosis on the day of admission. Postoperatively, she was on enteric protocol.  Her blood sugars were controlled with sliding scale.  She remained afebrile and hemodynamically stable.  Her ileus gradually resolved and she tolerated advancement of her diet.  Pain control was gradually able to be obtained with oral medications and she is discharged home on postop day #6 in stable condition.  DISCHARGED DIET:  Regular, diabetic as previously.  DISCHARGE ACTIVITY:  No lifting.  DISCHARGE MEDICATIONS: 1. Oxycodone 5-10 mg p.o. q.4 hours p.r.n. pain. 2. Ambien 10 mg p.o. nightly as needed for sleep. 3. She is also to continue her home medications of Lasix 20 mg daily. 4. Metformin 500 mg b.i.d. 5. Simvastatin 10 mg daily. Followup is on June 21, 2011, with myself.     Gabrielle Dare Janee Morn, M.D.     BET/MEDQ  D:  06/18/2011  T:  06/18/2011  Job:  [id number]  cc:   Deirdre Peer. Polite, M.D. Graylin Shiver, M.D.  Electronically Signed by Violeta Gelinas M.D. on 06/25/2011 08:50:59 AM

## 2011-06-26 ENCOUNTER — Telehealth (INDEPENDENT_AMBULATORY_CARE_PROVIDER_SITE_OTHER): Payer: Self-pay

## 2011-06-26 NOTE — Telephone Encounter (Signed)
I spoke to Dr Michaell Cowing.  He gave me an order for Phenergen 12.5mg  1-2 po q6hrs prn nausea #30 plus 2 refills.  She should stay on clear liquids for 2 days and if no better, needs to be seen.  I called the rx in to [facility] in [place] [phone] and notified the pt.  She actually has an appointment this week 10-31 with Dr Janee Morn.

## 2011-06-26 NOTE — Telephone Encounter (Signed)
Patient states she is nauseated since the weekend but not vomiting.  She has decreased appetite, no fever.  She is s/p sigmoid colectomy 10/15.  She would like something for nausea.  Of note, she is on Augmentin per Dr Janee Morn last Wednesday.   Her pharmacy right now is [facility] [place] [phone].

## 2011-06-28 ENCOUNTER — Encounter (INDEPENDENT_AMBULATORY_CARE_PROVIDER_SITE_OTHER): Payer: Self-pay | Admitting: General Surgery

## 2011-06-28 ENCOUNTER — Ambulatory Visit (INDEPENDENT_AMBULATORY_CARE_PROVIDER_SITE_OTHER): Payer: PRIVATE HEALTH INSURANCE | Admitting: General Surgery

## 2011-06-28 VITALS — BP 132/88 | HR 60 | Temp 98.8°F | Resp 20 | Ht 59.0 in | Wt 168.2 lb

## 2011-06-28 DIAGNOSIS — Z9889 Other specified postprocedural states: Secondary | ICD-10-CM

## 2011-06-28 DIAGNOSIS — K5732 Diverticulitis of large intestine without perforation or abscess without bleeding: Secondary | ICD-10-CM

## 2011-06-28 DIAGNOSIS — Z9049 Acquired absence of other specified parts of digestive tract: Secondary | ICD-10-CM

## 2011-06-28 NOTE — Patient Instructions (Signed)
Wet to dry dressings daily.

## 2011-06-28 NOTE — Progress Notes (Signed)
Subjective:     Patient ID: Mia [name], female   DOB: 02-13-1966, 45 y.o.   MRN: [id number]  HPI  Patient is status post sigmoid colectomy for diverticulitis on June 12, 2011. On her last visit she had a small wound infection in the upper portion of her incision. She's been doing wet to dries on that area to help with her cousin who is an Charity fundraiser. She is also taking Augmentin. The omentum has been given her some significant nausea. She is moving her bowels daily. Review of Systems     Objective:   Physical Exam On physical exam remainder of her wound is healing well the rest of the staples were removed and Steri-Strips were applied. The opening at the top is about 2.5 cm x 1 cm. It is clean granulation tissue. There was some serous drainage on her dressing. There is no odor. There is no ongoing erythema or cellulitis.    Assessment:      Coming along well after sigmoid colectomy for diverticulitis Plan:     Continue wet-to-dry dressings daily. Stop Augmentin. Continue to avoid heavy lifting. See her back in 2 weeks.

## 2011-07-12 ENCOUNTER — Ambulatory Visit (INDEPENDENT_AMBULATORY_CARE_PROVIDER_SITE_OTHER): Payer: PRIVATE HEALTH INSURANCE | Admitting: General Surgery

## 2011-07-12 ENCOUNTER — Encounter (INDEPENDENT_AMBULATORY_CARE_PROVIDER_SITE_OTHER): Payer: Self-pay | Admitting: General Surgery

## 2011-07-12 VITALS — BP 120/82 | HR 72 | Resp 18 | Wt 178.0 lb

## 2011-07-12 DIAGNOSIS — Z9049 Acquired absence of other specified parts of digestive tract: Secondary | ICD-10-CM

## 2011-07-12 DIAGNOSIS — Z9889 Other specified postprocedural states: Secondary | ICD-10-CM

## 2011-07-12 NOTE — Progress Notes (Signed)
Subjective:     Patient ID: Mia [name], female   DOB: [birth year], 45 y.o.   MRN: [id number]  HPI Patient presents for followup status post sigmoid colectomy. She had a small wound infection which has improved. She has had some significant constipation. She is taking some MiraLax. She also used an enema with incomplete relief. She is passing lots of gas. She is no longer taking pain medication.  Review of Systems     Physical exam abdomen soft and nontender. Her wound has healed. Dressing was removed and did not need to be replaced. Some Steri-Strips are still in place. There is no significant tenderness.   Physical Exam     Assessment:     Coming along well after sigmoid colectomy    Plan:     I encouraged gradually increasing walking. I feel this will help with some muscle soreness including her back. In addition I suggested she take milk of magnesia daily. She needs to get her bowel movements more regular. We discussed going back to driving she still has a tent and is going to hold off until next week. We'll see her back in a couple weeks.

## 2011-07-12 NOTE — Patient Instructions (Signed)
Take milk of magnesia daily

## 2011-07-19 ENCOUNTER — Ambulatory Visit (INDEPENDENT_AMBULATORY_CARE_PROVIDER_SITE_OTHER): Payer: PRIVATE HEALTH INSURANCE

## 2011-07-19 DIAGNOSIS — Z4802 Encounter for removal of sutures: Secondary | ICD-10-CM

## 2011-07-19 NOTE — Progress Notes (Signed)
Patient came in because she had a staple left at her umbilicus.  I easily removed it. She has a follow up appointment next week with Dr Janee Morn.

## 2011-07-23 ENCOUNTER — Encounter (INDEPENDENT_AMBULATORY_CARE_PROVIDER_SITE_OTHER): Payer: Self-pay | Admitting: General Surgery

## 2011-07-26 ENCOUNTER — Encounter (INDEPENDENT_AMBULATORY_CARE_PROVIDER_SITE_OTHER): Payer: PRIVATE HEALTH INSURANCE | Admitting: General Surgery

## 2011-08-09 ENCOUNTER — Encounter (INDEPENDENT_AMBULATORY_CARE_PROVIDER_SITE_OTHER): Payer: Self-pay | Admitting: General Surgery

## 2011-08-09 ENCOUNTER — Ambulatory Visit (INDEPENDENT_AMBULATORY_CARE_PROVIDER_SITE_OTHER): Payer: PRIVATE HEALTH INSURANCE | Admitting: General Surgery

## 2011-08-09 VITALS — BP 128/64 | HR 74 | Temp 97.0°F | Resp 18 | Ht 59.0 in | Wt 180.4 lb

## 2011-08-09 DIAGNOSIS — Z9889 Other specified postprocedural states: Secondary | ICD-10-CM

## 2011-08-09 DIAGNOSIS — Z9049 Acquired absence of other specified parts of digestive tract: Secondary | ICD-10-CM

## 2011-08-09 MED ORDER — ZOLPIDEM TARTRATE ER 12.5 MG PO TBCR: 12.5000 mg | EXTENDED_RELEASE_TABLET | Freq: Every evening | ORAL | Status: AC | PRN

## 2011-08-09 NOTE — Progress Notes (Signed)
Subjective:     Patient ID: Mia [name], female   DOB: 06/24/1966, 45 y.o.   MRN: [id number]  HPI Patient presents status post sigmoid colectomy. She's been moving her bowels pretty regularly. Occasionally drinks increase the frequency of her bowel movements. She has also had some intermittent bloating especially in her upper abdomen. She has no abdominal pain. She takes milk of magnesia with relief when she gets the bloating  Review of Systems     Objective:   Physical Exam Abdomen soft and nontender. There is no significant distention. Her incision is well-healed. There is no hernia. Bowel sounds are active.    Assessment:     Coming along well after sigmoid colectomy    Plan:     Continue milk of magnesia occasionally as needed. The patient also noted and revisit that she's having some ongoing problems with insomnia. She took Ambien while in the hospital and postoperatively. I could prescribe that for one month. After this she will need to discuss it with her primary care physician if it is a continuing problem.we will see her back as needed.

## 2012-05-01 ENCOUNTER — Encounter (INDEPENDENT_AMBULATORY_CARE_PROVIDER_SITE_OTHER): Payer: Self-pay | Admitting: General Surgery

## 2012-05-22 ENCOUNTER — Encounter (INDEPENDENT_AMBULATORY_CARE_PROVIDER_SITE_OTHER): Payer: Self-pay | Admitting: General Surgery

## 2012-05-28 ENCOUNTER — Encounter (INDEPENDENT_AMBULATORY_CARE_PROVIDER_SITE_OTHER): Payer: Self-pay | Admitting: General Surgery

## 2012-10-16 ENCOUNTER — Encounter (HOSPITAL_COMMUNITY): Payer: Self-pay

## 2012-10-16 ENCOUNTER — Emergency Department (INDEPENDENT_AMBULATORY_CARE_PROVIDER_SITE_OTHER)
Admission: EM | Admit: 2012-10-16 | Discharge: 2012-10-16 | Disposition: A | Discharge status: Home / Self Care | Payer: Self-pay | Source: Home / Self Care | Attending: Emergency Medicine | Admitting: Emergency Medicine

## 2012-10-16 DIAGNOSIS — I10 Essential (primary) hypertension: Secondary | ICD-10-CM

## 2012-10-16 DIAGNOSIS — R05 Cough: Secondary | ICD-10-CM

## 2012-10-16 DIAGNOSIS — Z760 Encounter for issue of repeat prescription: Secondary | ICD-10-CM

## 2012-10-16 DIAGNOSIS — R059 Cough, unspecified: Secondary | ICD-10-CM

## 2012-10-16 MED ORDER — METFORMIN HCL 500 MG PO TABS: 500.0000 mg | ORAL_TABLET | Freq: Every day | ORAL | Status: AC

## 2012-10-16 MED ORDER — HYDROCHLOROTHIAZIDE 25 MG PO TABS: 25.0000 mg | ORAL_TABLET | Freq: Every day | ORAL | Status: AC

## 2012-10-16 MED ORDER — ALBUTEROL SULFATE HFA 108 (90 BASE) MCG/ACT IN AERS: 1.0000 | INHALATION_SPRAY | Freq: Four times a day (QID) | RESPIRATORY_TRACT | Status: AC | PRN

## 2012-10-16 NOTE — ED Provider Notes (Addendum)
All History     CSN: [id number]  Arrival date & time 10/16/12  1557   First MD Initiated Contact with Patient 10/16/12 1630      Chief Complaint  Patient presents with  . Hypertension    (Consider location/radiation/quality/duration/timing/severity/associated sxs/prior treatment) HPI Comments: Patient presents urgent care requesting multiple medication refills. She describes she's been out of her blood pressure medicines for about 2 weeks and has been dismissed from her primary care doctor. She also describes she's been monitoring her sugars and she sees typically numbers around 160-170. She has also ran out of her metformin is also requesting medications to be refilled. Describes that occasionally she gets  cough spells along with chest tightness and wheezing.   At this on patient denies any chest pain palpitation shortness of breath   Patient is a 47 y.o. female presenting with hypertension. The history is provided by the patient.  Hypertension This is a new problem. The problem occurs constantly. The problem has not changed since onset.Associated symptoms include headaches. Pertinent negatives include no chest pain. Nothing aggravates the symptoms. The treatment provided no relief.    Past Medical History  Diagnosis Date  . Diabetes mellitus   . Hypertension   . Abdominal pain   . Cancer     breast  . Fatigue   . Chills   . Hyperlipidemia   . Cough with sputum     Past Surgical History  Procedure Laterality Date  . Biopsy breast  08/1993    right  . Appendectomy  1996  . Sigmoid colectomy  06/12/2011    Family History  Problem Relation Age of Onset  . Diabetes Mother   . Hypertension Father     History  Substance Use Topics  . Smoking status: Former Smoker    Quit date: 03/07/1994  . Smokeless tobacco: Not on file  . Alcohol Use: Yes     Comment: socially    OB History   Grav Para Term Preterm Abortions TAB SAB Ect Mult Living                   Review of Systems  Constitutional: Negative for fever, chills, diaphoresis, activity change, appetite change, fatigue and unexpected weight change.  Eyes: Negative for visual disturbance.  Respiratory: Negative for cough.   Cardiovascular: Negative for chest pain.  Musculoskeletal: Negative for myalgias and arthralgias.  Skin: Negative for color change and rash.  Neurological: Positive for dizziness and headaches. Negative for tremors, seizures, syncope, speech difficulty, weakness and light-headedness.    Allergies  Review of patient's allergies indicates no known allergies.  Home Medications   Current Outpatient Rx  Name  Route  Sig  Dispense  Refill  . albuterol (PROVENTIL HFA;VENTOLIN HFA) 108 (90 BASE) MCG/ACT inhaler   Inhalation   Inhale 1-2 puffs into the lungs every 6 (six) hours as needed for wheezing or shortness of breath.   1 Inhaler   0   . amoxicillin-clavulanate (AUGMENTIN) 875-125 MG per tablet   Oral   Take 1 tablet by mouth 2 (two) times daily.   20 tablet   0   . ciprofloxacin (CIPRO) 500 MG tablet   Oral   Take 500 mg by mouth 2 (two) times daily.           . hydrochlorothiazide (HYDRODIURIL) 25 MG tablet   Oral   Take 1 tablet (25 mg total) by mouth daily.   30 tablet   1   . hydrochlorothiazide  25 MG tablet   Oral   Take 25 mg by mouth daily.           . metformin (FORTAMET) 500 MG (OSM) 24 hr tablet   Oral   Take 500 mg by mouth daily with breakfast.           . metFORMIN (GLUCOPHAGE) 500 MG tablet   Oral   Take 1 tablet (500 mg total) by mouth daily.   60 tablet   0   . metroNIDAZOLE (FLAGYL) 500 MG tablet   Oral   Take 500 mg by mouth 3 (three) times daily.           Marland Kitchen EXPIRED: zolpidem (AMBIEN CR) 12.5 MG CR tablet   Oral   Take 1 tablet (12.5 mg total) by mouth at bedtime as needed for sleep.   30 tablet   1     BP 119/74  Pulse 102  Temp(Src) 98.1 F (36.7 C) (Oral)  Resp 16  SpO2 97%  Physical Exam   Nursing note and vitals reviewed. Constitutional: Vital signs are normal.  Non-toxic appearance. She does not have a sickly appearance. She does not appear ill. No distress.  HENT:  Head: Normocephalic.  Eyes: Conjunctivae are normal. Pupils are equal, round, and reactive to light. No scleral icterus.  Neck: Neck supple.  Cardiovascular: Normal rate, regular rhythm, normal heart sounds and normal pulses.  Exam reveals no gallop and no friction rub.   No murmur heard. Pulmonary/Chest: Effort normal and breath sounds normal. She has no decreased breath sounds. She has no wheezes. She has no rhonchi. She has no rales.  Neurological: She is alert.  Skin: No rash noted. No erythema.    ED Course  Procedures (including critical care time)  Labs Reviewed - No data to display No results found.   1. Hypertension   2. Encounter for medication refill   3. Cough       MDM  Medication refills hydrochlorothiazide, metformin and albuterol provided to patient patient will also establish her primary care Dr. for continuity of care.        Jimmie Molly, MD 10/16/12 1807  Jimmie Molly, MD 10/16/12 [phone]

## 2012-10-16 NOTE — ED Notes (Signed)
C/o she is out of her Rx for HBP, and has been dismissed from Dr Nehemiah Settle office; has reportedly been having problems w cough , stiffness in her neck

## 2013-05-07 ENCOUNTER — Other Ambulatory Visit: Payer: Self-pay | Admitting: Bariatrics

## 2013-05-07 DIAGNOSIS — R109 Unspecified abdominal pain: Secondary | ICD-10-CM

## 2013-05-12 ENCOUNTER — Other Ambulatory Visit: Payer: Self-pay

## 2013-05-14 ENCOUNTER — Ambulatory Visit: Payer: Self-pay | Admitting: Bariatrics

## 2013-05-14 LAB — CBC WITH DIFFERENTIAL/PLATELET
Basophil #: 0 10*3/uL (ref 0.0–0.1)
Basophil %: 0.7 %
Eosinophil %: 1.8 %
HGB: 12.7 g/dL (ref 12.0–16.0)
Lymphocyte #: 2.3 10*3/uL (ref 1.0–3.6)
Lymphocyte %: 40.3 %
MCH: 29.5 pg (ref 26.0–34.0)
MCHC: 33.5 g/dL (ref 32.0–36.0)
MCV: 88 fL (ref 80–100)
Monocyte #: 0.5 x10 3/mm (ref 0.2–0.9)
Monocyte %: 9 %
Neutrophil #: 2.8 10*3/uL (ref 1.4–6.5)
Neutrophil %: 48.2 %
Platelet: 239 10*3/uL (ref 150–440)
RBC: 4.32 10*6/uL (ref 3.80–5.20)
RDW: 15 % — ABNORMAL HIGH (ref 11.5–14.5)
WBC: 5.8 10*3/uL (ref 3.6–11.0)

## 2013-05-14 LAB — APTT: Activated PTT: 33.4 secs (ref 23.6–35.9)

## 2013-05-14 LAB — COMPREHENSIVE METABOLIC PANEL
Albumin: 3.7 g/dL (ref 3.4–5.0)
Anion Gap: 3 — ABNORMAL LOW (ref 7–16)
Chloride: 103 mmol/L (ref 98–107)
Creatinine: 0.86 mg/dL (ref 0.60–1.30)
EGFR (Non-African Amer.): >60
Glucose: 126 mg/dL — ABNORMAL HIGH (ref 65–99)
Osmolality: 275 (ref 275–301)

## 2013-05-14 LAB — MAGNESIUM: Magnesium: 2.1 mg/dL

## 2013-05-14 LAB — PROTIME-INR
INR: 0.9
Prothrombin Time: 12.8 secs (ref 11.5–14.7)

## 2013-05-14 LAB — AMYLASE: Amylase: 74 U/L (ref 25–115)

## 2013-05-14 LAB — FOLATE: Folic Acid: 15 ng/mL (ref 3.1–100.0)

## 2013-05-14 LAB — PHOSPHORUS: Phosphorus: 2.6 mg/dL (ref 2.5–4.9)

## 2013-05-14 LAB — TSH: Thyroid Stimulating Horm: 0.592 u[IU]/mL

## 2013-05-14 LAB — HEMOGLOBIN A1C: Hemoglobin A1C: 7.9 % — ABNORMAL HIGH (ref 4.2–6.3)

## 2013-05-14 LAB — IRON AND TIBC: Iron Bind.Cap.(Total): 373 ug/dL (ref 250–450)

## 2013-06-03 ENCOUNTER — Inpatient Hospital Stay: Admission: RE | Admit: 2013-06-03 | Payer: Self-pay | Source: Ambulatory Visit

## 2013-06-13 ENCOUNTER — Ambulatory Visit
Admission: RE | Admit: 2013-06-13 | Discharge: 2013-06-13 | Disposition: A | Discharge status: Home / Self Care | Payer: BC Managed Care – PPO | Source: Ambulatory Visit | Attending: Bariatrics | Admitting: Bariatrics

## 2013-06-13 DIAGNOSIS — R109 Unspecified abdominal pain: Secondary | ICD-10-CM

## 2013-06-24 ENCOUNTER — Ambulatory Visit: Payer: Self-pay | Admitting: Bariatrics

## 2013-06-28 ENCOUNTER — Ambulatory Visit: Payer: Self-pay | Admitting: Bariatrics

## 2013-09-22 ENCOUNTER — Ambulatory Visit: Payer: Self-pay | Admitting: Bariatrics

## 2013-09-22 LAB — BASIC METABOLIC PANEL
Anion Gap: 2 — ABNORMAL LOW (ref 7–16)
BUN: 10 mg/dL (ref 7–18)
CALCIUM: 9.6 mg/dL (ref 8.5–10.1)
CO2: 31 mmol/L (ref 21–32)
Chloride: 102 mmol/L (ref 98–107)
Creatinine: 0.77 mg/dL (ref 0.60–1.30)
EGFR (Non-African Amer.): >60
GLUCOSE: 126 mg/dL — AB (ref 65–99)
Osmolality: 271 (ref 275–301)
POTASSIUM: 3.9 mmol/L (ref 3.5–5.1)
Sodium: 135 mmol/L — ABNORMAL LOW (ref 136–145)

## 2013-09-30 ENCOUNTER — Inpatient Hospital Stay: Payer: Self-pay | Admitting: Bariatrics

## 2013-10-01 LAB — CBC WITH DIFFERENTIAL/PLATELET
Basophil #: 0 10*3/uL (ref 0.0–0.1)
Basophil %: 0.5 %
EOS ABS: 0 10*3/uL (ref 0.0–0.7)
Eosinophil %: 0.2 %
HCT: 35.7 % (ref 35.0–47.0)
HGB: 11.8 g/dL — ABNORMAL LOW (ref 12.0–16.0)
LYMPHS ABS: 2.1 10*3/uL (ref 1.0–3.6)
Lymphocyte %: 24.1 %
MCH: 29.8 pg (ref 26.0–34.0)
MCHC: 33 g/dL (ref 32.0–36.0)
MCV: 90 fL (ref 80–100)
Monocyte #: 0.5 x10 3/mm (ref 0.2–0.9)
Monocyte %: 5.7 %
Neutrophil #: 6.1 10*3/uL (ref 1.4–6.5)
Neutrophil %: 69.5 %
Platelet: 232 10*3/uL (ref 150–440)
RBC: 3.96 10*6/uL (ref 3.80–5.20)
RDW: 14.4 % (ref 11.5–14.5)
WBC: 8.7 10*3/uL (ref 3.6–11.0)

## 2013-10-01 LAB — BASIC METABOLIC PANEL
ANION GAP: 6 — AB (ref 7–16)
BUN: 7 mg/dL (ref 7–18)
CALCIUM: 8.3 mg/dL — AB (ref 8.5–10.1)
Chloride: 104 mmol/L (ref 98–107)
Co2: 27 mmol/L (ref 21–32)
Creatinine: 0.78 mg/dL (ref 0.60–1.30)
EGFR (African American): >60
GLUCOSE: 126 mg/dL — AB (ref 65–99)
Osmolality: 273 (ref 275–301)
POTASSIUM: 4 mmol/L (ref 3.5–5.1)
Sodium: 137 mmol/L (ref 136–145)

## 2013-10-04 LAB — PATHOLOGY REPORT

## 2013-10-16 ENCOUNTER — Ambulatory Visit: Payer: Self-pay | Admitting: General Practice

## 2013-12-10 ENCOUNTER — Ambulatory Visit: Payer: Self-pay | Admitting: General Practice

## 2014-12-19 NOTE — Discharge Summary (Signed)
Dates of Admission and Diagnosis:  Date of Admission 30-Sep-2013   Date of Discharge 02-Oct-2013   Admitting Diagnosis morbid obesity   Final Diagnosis morbid obesity   2 GERD    Chief Complaint/History of Present Illness long standing morbid obesity despite multiple diets. Also suffers from chronic acid reflux.   Allergies:  Adhesive: Itching  Pertinent Past History:  Pertinent Past History long standing reflux   Hospital Course:  Hospital Course Tolerated sleeve gastrectomy with moderate incisional pain and nausea early after surgery. after 48 hours these were improved and allowed d/c to home.   Condition on Discharge Stable   Code Status:  Code Status Full Code   DISCHARGE INSTRUCTIONS HOME MEDS:  Medication Reconciliation: Patient's Home Medications at Discharge:     Medication Instructions  tamoxifen 20 mg oral tablet  1 tab(s) orally once a day   lisinopril 20 mg oral tablet  1 tab(s) orally once a day    STOP TAKING THE FOLLOWING MEDICATION(S):    metformin 500 mg oral tablet: 1 tab(s) orally once a day lasix 20 mg oral tablet: 1 tab(s) orally once a day fibercon 625 mg oral tablet: 1 tab(s) orally once a day fish oil 500 mg oral capsule: 1 cap(s) orally once a day vitamin d3 2000 intl units oral capsule: 1 cap(s) orally once a day multivitamin: 1 tab(s)  once a day  Physician's Instructions:  Home Health? No   Treatments None   Dressing Care Replace dressing as necessary.  May shower.   Diet bariatric liquids as per protocol   Activity Limitations No heavy lifting   Return to Work after follow up visit with MD   Time frame for Follow Up Appointment 1-2 weeks  Shawnia Vizcarrondo   Electronic Signatures: Ladora Daniel (MD)  (Signed 08-Apr-15 11:33)  Authored: ADMISSION DATE AND DIAGNOSIS, CHIEF COMPLAINT/HPI, Allergies, PERTINENT PAST HISTORY, HOSPITAL COURSE, DISCHARGE INSTRUCTIONS HOME MEDS, PATIENT INSTRUCTIONS   Last Updated: 08-Apr-15 11:33 by  Ladora Daniel (MD)

## 2014-12-19 NOTE — Consult Note (Signed)
Brief Consult Note: Diagnosis: 1. Uncontrolled HTN 2. DM 3. hx of Breast Cancer 4. s/p lap gastric sleeve.   Patient was seen by consultant.   Consult note dictated.   Orders entered.   Comments: 49 yo female w/ hx of DM, HTN, recurrent breast cancer, came into hospital for a lap grastric sleeve.  Post-op noted to have uncontrolled HTN.   1. Uncontrolled HTN - will start on Lisinopril.  add PRN hydralazine and monitor.  - some of the this is due to pain post-operatively.   2. DM - hold metformin and cont. SSI for now.   3. hx of Breast Cancer - cont. Tamoxifen.  4. s/p lap gastric sleeve - cont. care as per surgery.  - cont. pain control as per surgery.   Thanks for the consult and will follow with you.  Job # [id number].  Electronic Signatures: Henreitta Leber (MD)  (Signed 04-Feb-15 11:33)  Authored: Brief Consult Note   Last Updated: 04-Feb-15 11:33 by Henreitta Leber (MD)

## 2014-12-19 NOTE — Consult Note (Signed)
PATIENT NAME:  Mia [name], Mia [name] MR#:  [id number] DATE OF BIRTH:  24-Nov-1965  DATE OF CONSULTATION:  10/01/2013  REFERRING PHYSICIAN:  Dr. Guinevere Scarlet CONSULTING PHYSICIAN:  Belia Heman. Verdell Carmine, MD  PRIMARY CARE PHYSICIAN: Located in [place].   REASON FOR CONSULTATION: Hypertension and medical management.   HISTORY OF PRESENT ILLNESS: This is a 49 year old female who presented to the hospital today for an elective lap gastric sleeve. Post surgery, the patient was noted to have uncontrolled blood pressures, and hospitalist services were contacted for management of hypertension and her other medical problems. The patient presently complains of some epigastric pain and some nausea, but no other associated symptoms presently. She denies any chest pain, shortness of breath, fevers, chills, cough, diarrhea or any other associated symptoms presently.   REVIEW OF SYSTEMS:  CONSTITUTIONAL: No documented fever. No weight gain. No weight loss.  EYES: No blurred or double vision.  ENT: No tinnitus or postnasal drip. No redness of the oropharynx.  RESPIRATORY: No cough. No wheeze. No hemoptysis. No dyspnea.  CARDIOVASCULAR: No chest pain. No orthopnea. No palpitations or syncope.  GASTROINTESTINAL: Positive nausea. No vomiting. No diarrhea. No abdominal pain. No melena or hematochezia.  GENITOURINARY: No dysuria or hematuria.  ENDOCRINE: No polyuria or nocturia, heat or cold intolerance.  HEMATOLOGIC: No anemia. No bruising. No bleeding.  INTEGUMENTARY: No rashes. No lesions.  MUSCULOSKELETAL: No arthritis. No swelling. No gout.  NEUROLOGIC: No numbness or tingling. No ataxia. No seizure-type activity.  PSYCHIATRIC: No anxiety. No insomnia. No ADD.   PAST MEDICAL HISTORY: Consistent with hypertension, diabetes, history of recurrent breast cancer.   ALLERGIES: No known drug allergies.  SOCIAL HISTORY: Used to be a smoker, quit many years ago. Occasional alcohol abuse. No illicit drug  abuse.   FAMILY HISTORY: Both mother and father are alive. Mother has diabetes and high blood pressure. Father also has hypertension.   CURRENT MEDICATIONS: Are as follows: FiberCon 625 mg daily, fish oil 500 mg daily, Lasix 20 mg daily, metformin 500 mg daily, multivitamin  daily, tamoxifen 20 mg daily, vitamin D3, 2000 International Units daily.   PHYSICAL EXAMINATION: Presently is as follows:  VITAL SIGNS: Temperature is 98.8, pulse 78, respirations 17, blood pressure 143/83, sats 94% on 2 liters nasal cannula.  GENERAL: She is a pleasant-appearing female, in no apparent distress.  HEAD, EYES, EARS, NOSE, THROAT: She is atraumatic, normocephalic. Extraocular muscles are intact. Pupils are equal and reactive to light. Sclerae anicteric. No conjunctival injection. No pharyngeal erythema.  NECK: Supple. There is no jugular venous distention. No bruits. No lymphadenopathy or thyromegaly.  HEART: Regular rate and rhythm. No murmurs. No rubs. No clicks.  LUNGS: Clear to auscultation bilaterally. No rales. No rhonchi. No wheezes.  ABDOMEN: Soft, flat, nontender, nondistended. Has good bowel sounds. No hepatosplenomegaly appreciated.  EXTREMITIES: No evidence of any cyanosis, clubbing or peripheral edema. Has +2 pedal and radial pulses bilaterally.  NEUROLOGICAL: The patient is alert, awake and oriented x 3 with no focal motor or sensory deficits appreciated bilaterally.  SKIN: Moist, warm with no rashes appreciated. In fact, there is no cervical or axillary lymphadenopathy.   LABORATORY DATA: Shows a serum glucose of 126. BUN 7, creatinine 0.7, sodium 137, potassium 4.0, chloride 104, bicarb 27. White cell count 8.7, hemoglobin 11.8, hematocrit 35.7, platelet count 232.   ASSESSMENT AND PLAN: This is a 49 year old female with a history of diabetes, hypertension, recurrent breast cancer, came into the hospital for a lap gastric sleeve. Postoperatively, the  patient noted to have uncontrolled  hypertension.  1.  Uncontrolled hypertension. Some of her blood pressures being elevated is related to her pain postoperatively, The patient apparently was started on lisinopril recently, but has not filled the prescription. I will start her on lisinopril for now, add p.r.n. hydralazine and follow hemodynamics for now.  2.  Diabetes. Hold her metformin for now. Continue sliding scale insulin.  3.  History of breast cancer: Continue with her tamoxifen.  4.  Status post lap gastric sleeve placement. Continue care as per surgery. Continue pain control as per surgery.  5.  Osteoporosis. Continue with vitamin D supplements.   The patient is a FULL CODE.   Thank you so much for the consultation. We will follow along with you.   TIME SPENT: 45 minutes.   ____________________________ Belia Heman. Verdell Carmine, MD vjs:dmm D: 10/01/2013 11:33:31 ET T: 10/01/2013 12:01:35 ET JOB#: [id number]  cc: Belia Heman. Verdell Carmine, MD, <Dictator> Henreitta Leber MD ELECTRONICALLY SIGNED 10/02/2013 11:53

## 2014-12-19 NOTE — Op Note (Signed)
PATIENT NAME:  Mia [name], Mia [name] MR#:  [id number] DATE OF BIRTH:  [birth year]  DATE OF PROCEDURE:  09/30/2013  PROCEDURE PERFORMED:  Laparoscopic sleeve gastrectomy with repair of hiatal hernia.  PREOPERATIVE DIAGNOSIS:  Morbid obesity associated presence of hiatal hernia and gastroesophageal reflux disease.  POSTOPERATIVE DIAGNOSIS:  Morbid obesity associated presence of hiatal hernia and gastroesophageal reflux disease with a 1 cm hiatal hernia.   SPECIMEN REMOVED:  Lateral stomach.   ESTIMATED BLOOD LOSS:  Minimal.   PROCEDURE:  The patient was brought to the Operating Room and placed in a supine position.  General anesthesia was obtained with orotracheal intubation.  The patient had TED hose and Thromboguards applied.  A foot board applied at the end of the operative bed.  The abdomen and chest sterilely prepped and draped.  A 5 mm Optiview trocar introduced in the left upper quadrant of the abdomen under direct visualization.  Pneumoperitoneum obtained with carbon dioxide.  Three additional trocars were introduced across the upper abdomen and a Nathanson liver retractor introduced through a subxiphoid defect.  The patient with a fairly prominent fat pad lying in the region of the upper stomach obstructing complete visualization of the hiatus.  The patient's preoperative upper GI suggested the presence of a small hiatal hernia.  For improved visualization the fat pad was mobilized away from the upper stomach and the undersurface of the left hemidiaphragm.  The upper stomach also freed from the undersurface of the left hemidiaphragm.  In so doing, the GE junction identified noted to be lying just within the lower mediastinum.  It was decided to proceed with mobilization of the distal esophagus and posterior crural repair.  The patient had division of the ligamentous aspects of the gastrohepatic ligament.  This was then extended across the peritoneum across the anterior hiatus.  Blunt  dissection was used to reduce herniated peritoneum and also mobilize the lower esophagus and vagal nerve away from the overlying pericardium.  The peritoneum was then incised along the lateral aspect of the right crural margin and blunt dissection then used to reduce herniated lesser sac, fatty tissue exposing the posterior vagal nerve and the posterior aspect of the esophagus.  Phrenoesophageal ligaments divided along the lateral aspect of the esophagus freeing the esophagus from the left crural margin.  Circumferential dissection of the esophagus was then performed within the lower mediastinum sweeping the esophagus away from the underlying aorta, the pleural surfaces and the overlying pericardium.  This dissection was extended into the lower mediastinum over a distance of approximately 6 to 7 cm, ultimately delivery 1.5 cm of esophagus lying comfortably in the abdominal cavity.  A posterior crural repair was then performed with 3 interrupted 0 Ethibond sutures leaving a small margin of space around the esophagus within the confines of the hiatus.  At this point, the pylorus was identified and marked.  Beginning approximately 4 cm proximal to this, arcade vessels were divided along the greater curvature of the stomach extending this proximally.  After mobilizing approximately 8 cm of fundic margin, a 36 French ViSiGi device was directed into the region of the antrum and placed to suction.  This was then used as a partial template for creation of a medially based gastric sleeve effect.  A series of GI staplers then used to create the gastric sleeve.  The first firing was a green load staple placed in a relative transverse direction in an effort to avoid narrowing in the region of the incisura.  A series  of gold load staples were then deployed with the second again being placed in a relative transverse direction followed by a  more vertical line of staples brought out just lateral to the angle of (Dictation Anomaly)  His leaving a small dog ear of stomach in its region to prevent or lower the risk of a leak.  The patient then had division of the residual gastric vascular pedicles along the greater curvature of the stomach with full mobilization of the gastrosplenic and gastrocolic ligament.  The ViSiGi device was then used for insufflation of the gastric tube.  With the bowel occluded in the region above the pylorus, there was no evidence of air leak noted along the staple line.  The ViSiGi was withdrawn at this point.  The lateral stomach retrieved through the right upper quadrant 15 mm trocar site.  The fascia and peritoneum of this defect closed with a 0 Vicryl suture passed by way of a needle suture passing system.  Prior to retrieval of the gastric specimen the posterior apex of the stomach was secured to the underlying crural repair area in an effort to prevent proximal migration of the apex of the stomach into the lower mediastinum.  After completion of closure of the peritoneum and fascia the larger trocar site and pneumoperitoneum relieved.  The trocars were removed.  The wounds were injected with 0.25% Marcaine followed by 4-0 Monocryl in the dermis followed by Dermabond.  The patient at this time was allowed to recover from having tolerated the procedure well.      ____________________________ Venia Carbon. Duke Salvia, MD mat:ea D: 10/01/2013 22:48:39 ET T: 10/02/2013 03:15:09 ET JOB#: [id number]  cc: Legrand Como A. Duke Salvia, MD, <Dictator> Ladora Daniel MD ELECTRONICALLY SIGNED 10/07/2013 20:46

## 2015-08-31 IMAGING — US ABDOMEN ULTRASOUND LIMITED
1 series · 14 of 25 positions shown · non-contrast
Comparison: none

REASON FOR EXAM: diabetic abd pain snoring HTN high cholesterol morbid
obesity
COMMENTS:

[Series 1: abdomen ultrasound limited · 0.40mm/px · 14 of 58 slices shown]
[im 1/58]
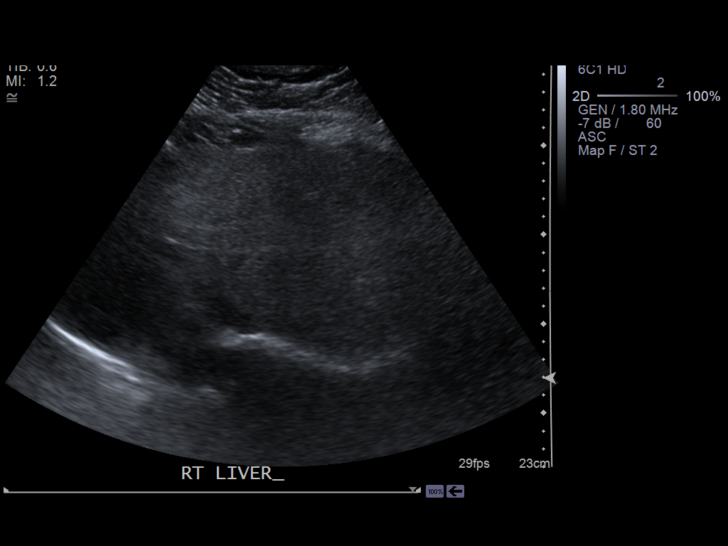
[im 5/58]
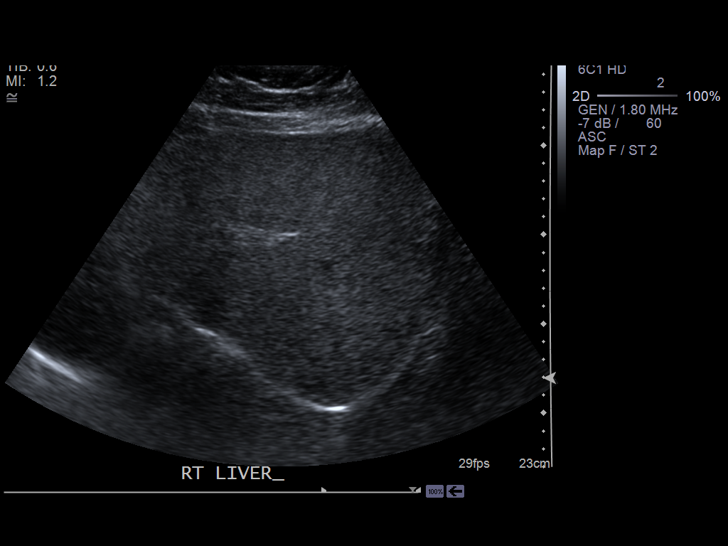
[im 10/58]
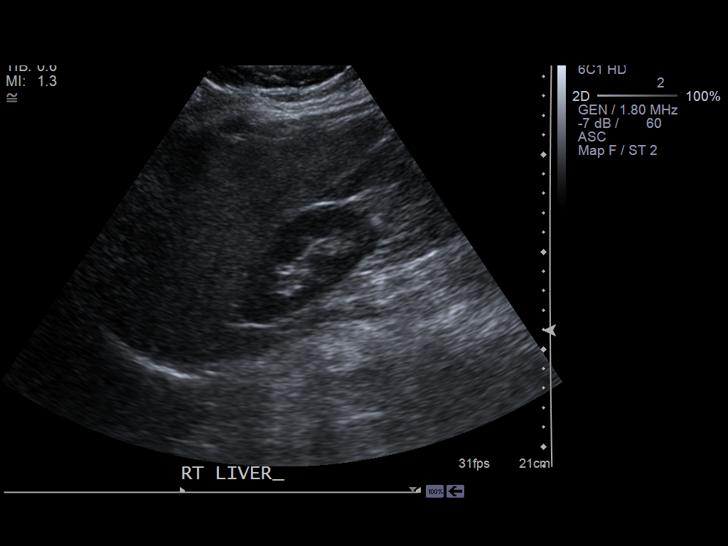
[im 15/58]
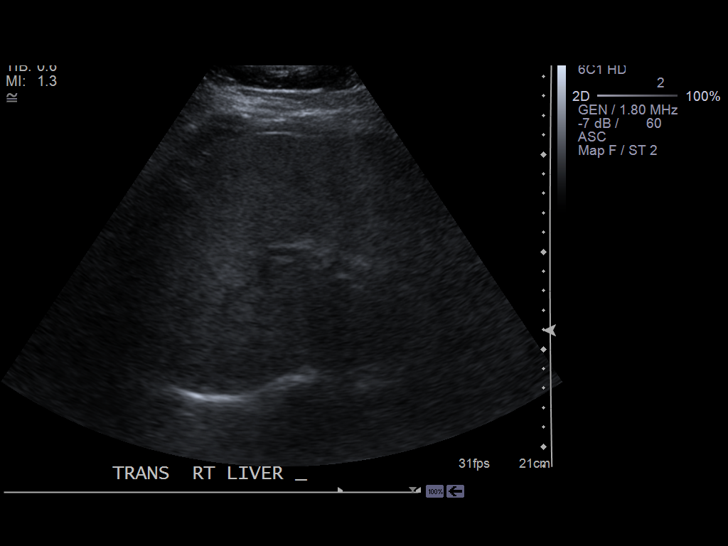
[im 20/58]
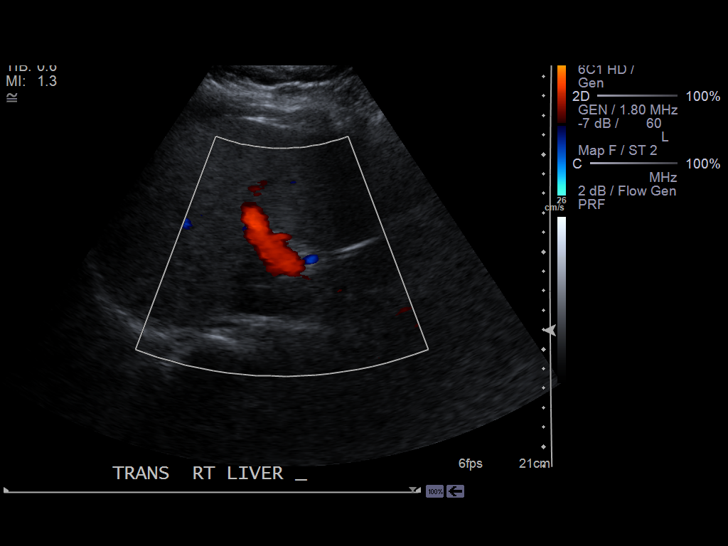
[im 22/58]
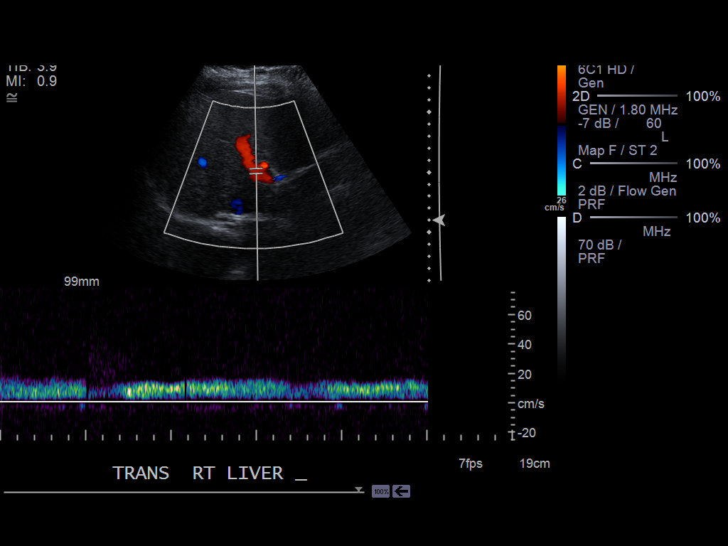
[im 27/58]
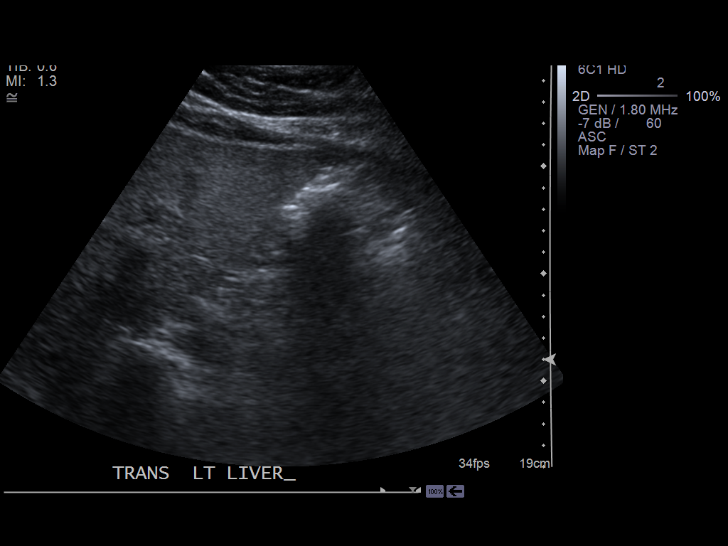
[im 31/58]
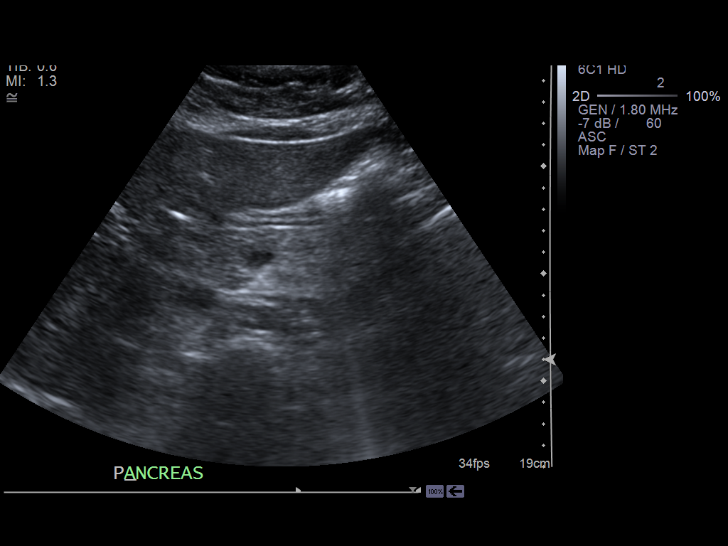
[im 36/58]
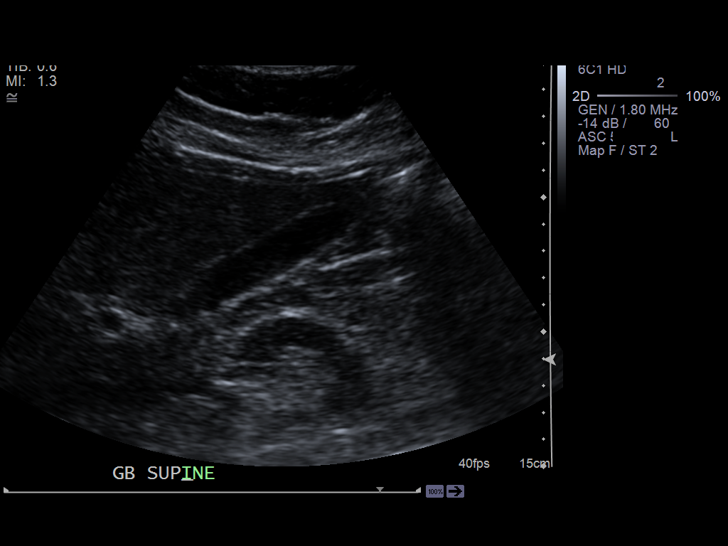
[im 39/58]
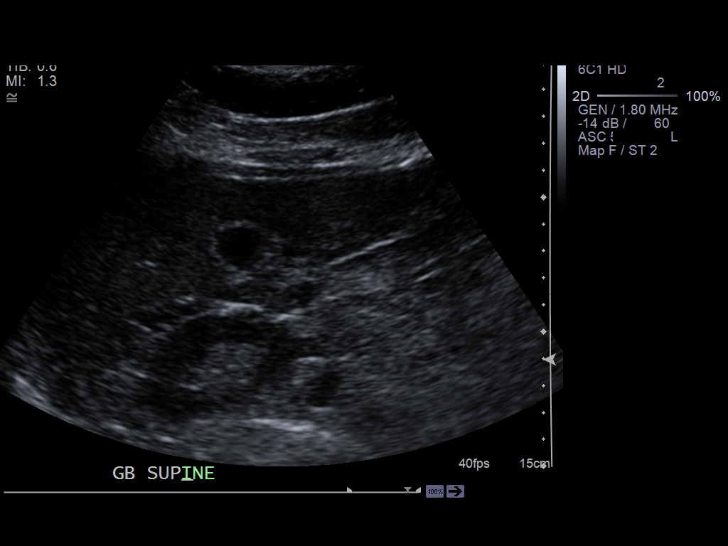
[im 43/58]
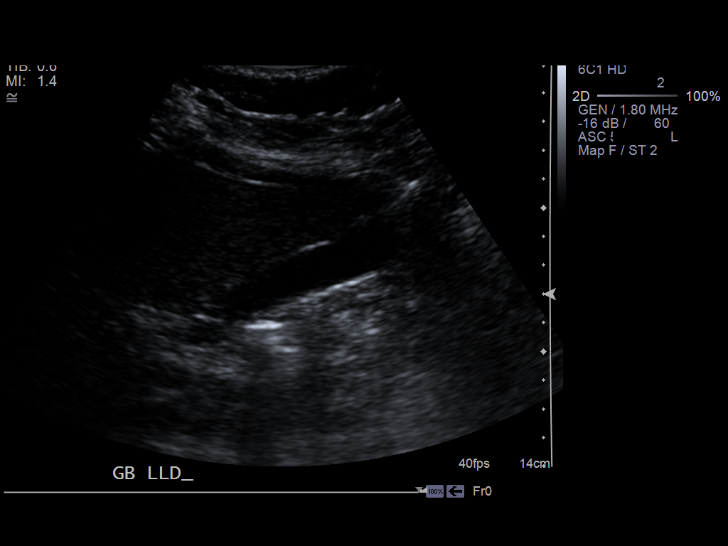
[im 48/58]
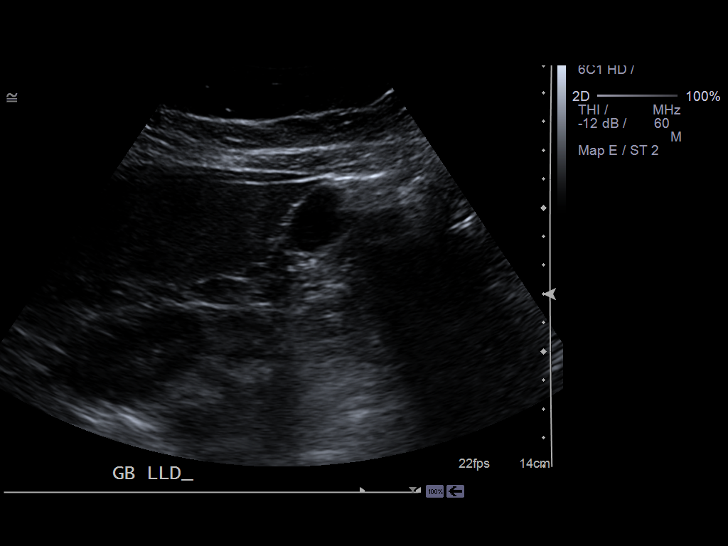
[im 53/58]
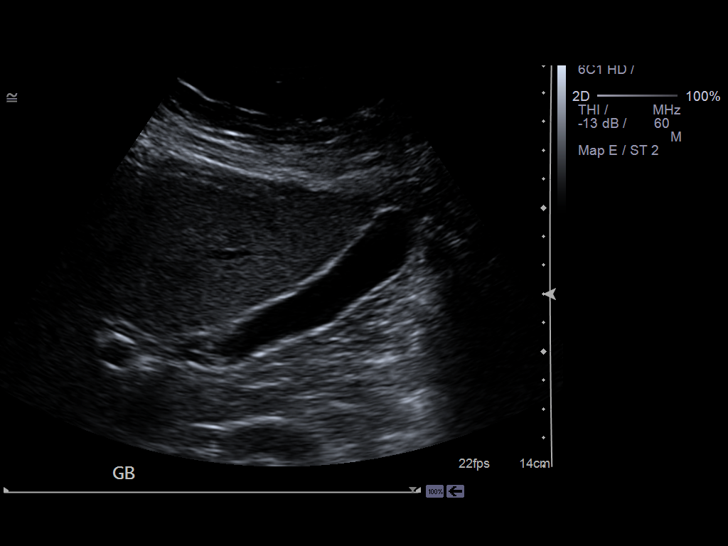
[im 58/58]
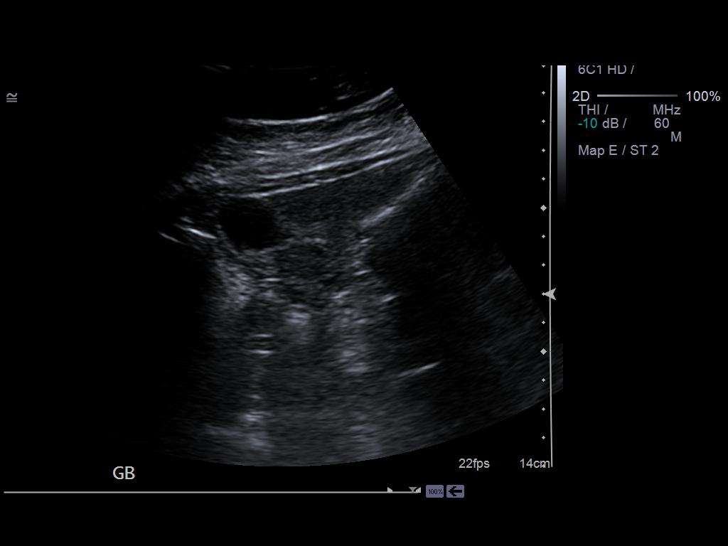

[14 of 25 positions shown; findings below may reference images not displayed]

PROCEDURE:     US  - US ABDOMEN LIMITED SURVEY  - May 14, 2013  [DATE]

RESULT:     The liver exhibits normal echotexture with no focal mass nor
ductal dilation. Portal venous flow is normal in direction toward the liver.
The pancreas is normal in echotexture and contour with no evidence of ductal
dilation or parenchymal masses. The gallbladder is adequately distended with
no evidence of stones, wall thickening, or pericholecystic fluid. There is
no positive sonographic Murphy's sign. The common bile duct is normal at
mm in diameter.
IMPRESSION: Normal limited right upper abdominal ultrasound
examination.

[REDACTED]
